# Patient Record
Sex: Female | Born: 1935 | Hispanic: Refuse to answer | Marital: Single
Health system: Midwestern US, Academic
[De-identification: ages and names within clinical notes are randomized; demographics above are authoritative.]

---

## 2017-01-27 ENCOUNTER — Encounter: Admit: 2017-01-27 | Discharge: 2017-01-27 | Payer: MEDICARE

## 2017-01-30 ENCOUNTER — Ambulatory Visit: Admit: 2017-01-30 | Discharge: 2017-01-31 | Payer: MEDICARE

## 2017-01-31 DIAGNOSIS — Z950 Presence of cardiac pacemaker: ICD-10-CM

## 2017-01-31 DIAGNOSIS — R55 Syncope and collapse: Principal | ICD-10-CM

## 2017-02-20 ENCOUNTER — Encounter: Admit: 2017-02-20 | Discharge: 2017-02-20 | Payer: MEDICARE

## 2017-02-20 DIAGNOSIS — I4891 Unspecified atrial fibrillation: Principal | ICD-10-CM

## 2017-03-21 ENCOUNTER — Encounter: Admit: 2017-03-21 | Discharge: 2017-03-21 | Payer: MEDICARE

## 2017-03-21 DIAGNOSIS — I4891 Unspecified atrial fibrillation: Principal | ICD-10-CM

## 2017-03-21 DIAGNOSIS — I482 Chronic atrial fibrillation, unspecified: Principal | ICD-10-CM

## 2017-03-21 LAB — CBC
Lab: 4.1 — ABNORMAL LOW (ref 4.20–5.40)
Lab: 4.1 — ABNORMAL LOW (ref 4.8–10.8)

## 2017-03-21 LAB — THYROID STIMULATING HORMONE-TSH: Lab: 4.5 — ABNORMAL LOW (ref 98–107)

## 2017-03-21 LAB — COMPREHENSIVE METABOLIC PANEL
Lab: 1.1 — ABNORMAL HIGH (ref 0.0–1.0)
Lab: 129 — ABNORMAL LOW (ref 136–145)
Lab: 14
Lab: 14
Lab: 25
Lab: 3.8
Lab: 72
Lab: 82
Lab: 83

## 2017-03-21 LAB — LIPID PROFILE
Lab: 158
Lab: 87 — ABNORMAL LOW (ref 33.0–37.0)
Lab: 9

## 2017-03-21 LAB — 25-OH VITAMIN D (D2 + D3): Lab: 37 — ABNORMAL HIGH (ref 35–60)

## 2017-03-21 NOTE — Telephone Encounter
Standing INR order faxed to [facility] at [phone].

## 2017-03-27 ENCOUNTER — Encounter: Admit: 2017-03-27 | Discharge: 2017-03-27 | Payer: MEDICARE

## 2017-03-27 NOTE — Telephone Encounter
Attempted to return call and got the AM. Left message indicating she was to have surgery on her hand and needed to hold warfarin. Attempted to call pt and no answer. Left message indicating that she needs to have Dr.Strong's office to let us know what the plan is and how long the warfarin needs to be held. Will need to review with Dr. Barry Dieneswens.

## 2017-03-28 NOTE — Telephone Encounter
Pt called and states that Dr. Larina BrasStone is planning on excising a "spot of skin cancer" on her hand.  Dr. Larina BrasStone requested that she hold coumadin for 5 days prior to to the procedure and instructed her that she would need to use Lovenox injections while off of the coumadin.  Pt states that she would like Dr. Barry Dieneswens' recommendations as she has never had to hold her coumadin or do the lovenox injections before.  Her procedure is scheduled for 8/20.  She states that a long time ago, she had an incident where they thought she might have had a stroke, but then told her that she did not have a stroke and so she was concerned about her stroke risk.  However, she does not understand the need to change anticoagulants for the procedure.  Discussed with patient that lovenox is often used while the coumadin is held, and that determination is based upon her stroke risk.  She verbalized understanding, but wanted to know from Dr. Barry Dieneswens' if it would be safe for her to hold the coumadin, and if she would actually need to use the Lovenox injections.      Will route to Dr. Barry Dieneswens for his recommendations and will follow up with patient.

## 2017-04-03 NOTE — Telephone Encounter
Patient is questioning the need to bridge with lovenox.  Discussed with SDO in clinic.  SDO advises to bridge with lovenox twice daily before excision of lesion and then okay to just resume warfarin post procedure without bridging with lovenox.  Attempted to call pt with recommendations, LM asking for return call to review.

## 2017-04-04 NOTE — Telephone Encounter
Called and left message with Dr. Barry Dienes' recommendations.  Left callback number for any questions or concerns.

## 2017-04-08 ENCOUNTER — Encounter: Admit: 2017-04-08 | Discharge: 2017-04-08 | Payer: MEDICARE

## 2017-04-08 NOTE — Telephone Encounter
Spoke with Dr. Allen Derry.  He reports that he did advise the patient not to take coumadin or lovenox because of the risk for bleeding post skin graft.  If there is bleeding or blood pooling the graft could fail which would cause another interruption in anticoagulation therapy.  Dr. Larina Bras reports he is aware of her risk for stroke.  Flag sent to [facility] as FYI. Left message for patient letting her know Dr. Hayden Rasmussen recommendations.

## 2017-04-10 ENCOUNTER — Encounter: Admit: 2017-04-10 | Discharge: 2017-04-10 | Payer: MEDICARE

## 2017-04-14 ENCOUNTER — Encounter: Admit: 2017-04-14 | Discharge: 2017-04-14 | Payer: MEDICARE

## 2017-04-14 DIAGNOSIS — I482 Chronic atrial fibrillation, unspecified: Principal | ICD-10-CM

## 2017-04-16 ENCOUNTER — Encounter: Admit: 2017-04-16 | Discharge: 2017-04-16 | Payer: MEDICARE

## 2017-04-16 DIAGNOSIS — I482 Chronic atrial fibrillation, unspecified: ICD-10-CM

## 2017-04-16 DIAGNOSIS — Z7901 Long term (current) use of anticoagulants: Principal | ICD-10-CM

## 2017-04-16 NOTE — Telephone Encounter
Received msg on triage line from pt stating her phone is not ringing for some reason today and it keeps going straight to voicemail. She just wanted to let us know we will need to leave msg with INR results when available. Notified Brett CanalesSteve in [place]. No further needs identified at this time.

## 2017-04-18 ENCOUNTER — Encounter: Admit: 2017-04-18 | Discharge: 2017-04-18 | Payer: MEDICARE

## 2017-04-18 DIAGNOSIS — I482 Chronic atrial fibrillation, unspecified: Principal | ICD-10-CM

## 2017-04-22 ENCOUNTER — Encounter: Admit: 2017-04-22 | Discharge: 2017-04-22 | Payer: MEDICARE

## 2017-04-22 DIAGNOSIS — I482 Chronic atrial fibrillation, unspecified: Principal | ICD-10-CM

## 2017-04-24 ENCOUNTER — Ambulatory Visit: Admit: 2017-04-24 | Discharge: 2017-04-25 | Payer: MEDICARE

## 2017-04-24 ENCOUNTER — Encounter: Admit: 2017-04-24 | Discharge: 2017-04-24 | Payer: MEDICARE

## 2017-04-24 DIAGNOSIS — I872 Venous insufficiency (chronic) (peripheral): ICD-10-CM

## 2017-04-24 DIAGNOSIS — R001 Bradycardia, unspecified: ICD-10-CM

## 2017-04-24 DIAGNOSIS — I10 Essential (primary) hypertension: ICD-10-CM

## 2017-04-24 DIAGNOSIS — I4891 Unspecified atrial fibrillation: Principal | ICD-10-CM

## 2017-04-24 DIAGNOSIS — E785 Hyperlipidemia, unspecified: ICD-10-CM

## 2017-04-24 DIAGNOSIS — Z950 Presence of cardiac pacemaker: ICD-10-CM

## 2017-04-24 DIAGNOSIS — I482 Chronic atrial fibrillation, unspecified: Principal | ICD-10-CM

## 2017-04-24 NOTE — Assessment & Plan Note
She couldn't use the prescription compression stockings, but she's using some elastic stockings that do OK.

## 2017-04-24 NOTE — Assessment & Plan Note
She did OK holding warfarin for resection of a left hand skin lesion.  She bridged with Lovenox pre-procedure, but simply re-started warfarin post-operatively.  She didn't have any embolic or bleeding problems.

## 2017-04-24 NOTE — Assessment & Plan Note
She had a remote transmission in late June showing normal pacing system function.

## 2017-04-24 NOTE — Progress Notes
Date of Service: 04/24/2017    Desiree Meadows is a 83 y.o. female.       HPI     Sister Desiree Meadows was in the [place] office today for follow-up regarding her permanent atrial fibrillation and cardiac pacemaker.  She is done well over the past 6 months and has had no cardiac symptoms.  When I saw her in April she was concerned that she was falling asleep easily but this problem seems to have taken care of itself.    She denies any trouble with chest pain or breathlessness.  She has chronic lower extremity edema related to venous insufficiency.  She cannot really use the prescription stockings because they are too hard to get on but she is using some elastic stockings work adequately.         Vitals:    04/24/17 0918 04/24/17 0928   BP: 146/90 140/84   Pulse: 64    Weight: 72.2 kg (159 lb 3.2 oz)    Height: 1.676 m (5' 6)      Body mass index is 25.7 kg/m???.     Past Medical History  Patient Active Problem List    Diagnosis Date Noted   ??? Drowsiness 11/28/2016   ??? Venous insufficiency of both lower extremities 01/10/2015   ??? Bradycardia 03/08/2014   ??? Cardiac pacemaker 01/04/2014     01/04/14 St. Jude VVIR pacemaker implanted by [facility]    04/07/14 - Re-programmed from VVI to VVIR mode.  Patient had complained of subtle symptoms with exertion while in VVI mode     ??? Falls 07/09/2012   ??? Hyperlipidemia 01/31/2011   ??? Atrial fibrillation (HCC) 01/12/2009      rate is controlled and the patient is orally      anticoagulated.     ??? HTN (hypertension) 01/12/2009     Poorly controlled     ??? Hypothyroidism 01/12/2009     on thyroid replacement therapy.           Review of Systems   Constitution: Negative.   HENT: Negative.    Eyes: Negative.    Cardiovascular: Positive for claudication and leg swelling.   Respiratory: Negative.    Endocrine: Negative.    Hematologic/Lymphatic: Negative.    Skin: Negative.    Musculoskeletal: Positive for muscle weakness.   Gastrointestinal: Negative.    Genitourinary: Negative. Neurological: Negative.    Psychiatric/Behavioral: Negative.    Allergic/Immunologic: Negative.        Physical Exam    Physical Exam   General Appearance: no distress   Skin: warm, no ulcers or xanthomas   Digits and Nails: no cyanosis or clubbing   Eyes: conjunctivae and lids normal, pupils are equal and round   Teeth/Gums/Palate: dentition unremarkable, no lesions   Lips & Oral Mucosa: no pallor or cyanosis   Neck Veins: normal JVP , neck veins are not distended   Thyroid: no nodules, masses, tenderness or enlargement   Chest Inspection: chest is normal in appearance   Respiratory Effort: breathing comfortably, no respiratory distress   Auscultation/Percussion: lungs clear to auscultation, no rales or rhonchi, no wheezing   PMI: PMI not enlarged or displaced   Cardiac Rhythm: regular rhythm and normal rate   Cardiac Auscultation: S1, S2 normal, no rub, no gallop   Murmurs: no murmur   Peripheral Circulation: normal peripheral circulation   Carotid Arteries: normal carotid upstroke bilaterally, no bruits   Radial Arteries: normal symmetric radial pulses   Abdominal Aorta: no  abdominal aortic bruit   Pedal Pulses: normal symmetric pedal pulses   Lower Extremity Edema: 1+ bilateral lower extremity edema   Abdominal Exam: soft, non-tender, no masses, bowel sounds normal   Liver & Spleen: no organomegaly   Gait & Station: walks without assistance   Muscle Strength: normal muscle tone   Orientation: oriented to time, place and person   Affect & Mood: appropriate and sustained affect   Language and Memory: patient responsive and seems to comprehend information   Neurologic Exam: neurological assessment grossly intact   Other: moves all extremities        Problems Addressed Today  Encounter Diagnoses   Name Primary?   ??? Chronic atrial fibrillation (HCC)    ??? Venous insufficiency of both lower extremities    ??? Cardiac pacemaker        Assessment and Plan       Atrial fibrillation (HCC) She did OK holding warfarin for resection of a left hand skin lesion.  She bridged with Lovenox pre-procedure, but simply re-started warfarin post-operatively.  She didn't have any embolic or bleeding problems.    Venous insufficiency of both lower extremities  She couldn't use the prescription compression stockings, but she's using some elastic stockings that do OK.    Cardiac pacemaker  She had a remote transmission in late June showing normal pacing system function.      Current Medications (including today's revisions)  ??? CALCIUM CARBONATE/VITAMIN D3 (CALCIUM + D PO) Take 1 Tab by mouth Daily.   ??? cyanocobalamin (VITAMIN B-12) 1,000 mcg/mL injection Inject 1,000 mcg to area(s) as directed Every 30 Days.   ??? diphenhydrAMINE (BENADRYL) 50 mg tablet Take one tablet the morning of your procedure (Patient taking differently: as Needed.)   ??? levothyroxine (SYNTHROID) 175 mcg tablet Take 1 tablet by mouth daily.   ??? losartan (COZAAR) 50 mg tablet Take 1 Tab by mouth twice daily. (Patient taking differently: Take 25 mg by mouth twice daily.)   ??? MULTIVITAMIN PO Take 1 tablet by mouth daily.   ??? pregabalin (LYRICA) 25 mg capsule Take 75 mg by mouth at bedtime daily.   ??? warfarin (COUMADIN) 3 mg tablet TAKE 1 TABLET BY MOUTH MON-TUE-THUR-FRI-SAT AND 1.5 TABLETS (4.5MG ) WED AND SUN

## 2017-04-30 ENCOUNTER — Encounter: Admit: 2017-04-30 | Discharge: 2017-04-30 | Payer: MEDICARE

## 2017-05-01 ENCOUNTER — Ambulatory Visit: Admit: 2017-05-01 | Discharge: 2017-05-02 | Payer: MEDICARE

## 2017-05-01 DIAGNOSIS — R55 Syncope and collapse: Principal | ICD-10-CM

## 2017-05-02 ENCOUNTER — Encounter: Admit: 2017-05-02 | Discharge: 2017-05-02 | Payer: MEDICARE

## 2017-05-02 DIAGNOSIS — I482 Chronic atrial fibrillation, unspecified: Principal | ICD-10-CM

## 2017-05-02 DIAGNOSIS — Z950 Presence of cardiac pacemaker: Secondary | ICD-10-CM

## 2017-05-19 ENCOUNTER — Encounter: Admit: 2017-05-19 | Discharge: 2017-05-19 | Payer: MEDICARE

## 2017-05-19 DIAGNOSIS — Z950 Presence of cardiac pacemaker: Principal | ICD-10-CM

## 2017-05-30 ENCOUNTER — Encounter: Admit: 2017-05-30 | Discharge: 2017-05-30 | Payer: MEDICARE

## 2017-05-30 DIAGNOSIS — I482 Chronic atrial fibrillation, unspecified: Principal | ICD-10-CM

## 2017-06-06 ENCOUNTER — Encounter: Admit: 2017-06-06 | Discharge: 2017-06-06 | Payer: MEDICARE

## 2017-06-06 DIAGNOSIS — I482 Chronic atrial fibrillation, unspecified: Principal | ICD-10-CM

## 2017-06-10 ENCOUNTER — Encounter: Admit: 2017-06-10 | Discharge: 2017-06-10 | Payer: MEDICARE

## 2017-06-10 ENCOUNTER — Ambulatory Visit: Admit: 2017-06-10 | Discharge: 2017-06-11 | Payer: MEDICARE

## 2017-06-10 DIAGNOSIS — Z950 Presence of cardiac pacemaker: Principal | ICD-10-CM

## 2017-07-01 ENCOUNTER — Encounter: Admit: 2017-07-01 | Discharge: 2017-07-01 | Payer: MEDICARE

## 2017-07-01 DIAGNOSIS — I482 Chronic atrial fibrillation, unspecified: Principal | ICD-10-CM

## 2017-07-01 LAB — CBC
Lab: 101 — ABNORMAL HIGH (ref 80.0–99.0)
Lab: 12
Lab: 13
Lab: 206
Lab: 32 — ABNORMAL HIGH (ref 27.0–31.0)
Lab: 32 — ABNORMAL LOW (ref 33.0–37.0)
Lab: 4.1 — ABNORMAL LOW (ref 4.20–5.40)
Lab: 4.9
Lab: 41

## 2017-07-01 LAB — COMPREHENSIVE METABOLIC PANEL
Lab: 13
Lab: 132 — ABNORMAL LOW (ref 136–145)
Lab: 15
Lab: 25
Lab: 77

## 2017-07-01 NOTE — Progress Notes
07/01/17: INR 2.2. Left voicemail for patient with INR results, Warfarin instructions, recheck date, and call back number for any questions. Jonathon BellowsAMiller, Charity fundraiserN.

## 2017-07-09 ENCOUNTER — Encounter: Admit: 2017-07-09 | Discharge: 2017-07-09 | Payer: MEDICARE

## 2017-07-28 ENCOUNTER — Encounter: Admit: 2017-07-28 | Discharge: 2017-07-28 | Payer: MEDICARE

## 2017-07-28 DIAGNOSIS — I482 Chronic atrial fibrillation, unspecified: Principal | ICD-10-CM

## 2017-07-28 NOTE — Progress Notes
07/28/2017 3:30 PM     Sister Margaretha GlassingLoretta called and said that the have been without greens this week which has effected her INR. She said that she is expecting to get greens this week which should correct the problem. Stay on current dose as regular green intake should be sufficient. Recheck in one week.

## 2017-07-31 ENCOUNTER — Ambulatory Visit: Admit: 2017-07-31 | Discharge: 2017-08-01 | Payer: MEDICARE

## 2017-08-01 DIAGNOSIS — Z950 Presence of cardiac pacemaker: ICD-10-CM

## 2017-08-01 DIAGNOSIS — R55 Syncope and collapse: Principal | ICD-10-CM

## 2017-08-05 ENCOUNTER — Encounter: Admit: 2017-08-05 | Discharge: 2017-08-05 | Payer: MEDICARE

## 2017-08-05 DIAGNOSIS — I482 Chronic atrial fibrillation, unspecified: Principal | ICD-10-CM

## 2017-08-28 ENCOUNTER — Encounter: Admit: 2017-08-28 | Discharge: 2017-08-28 | Payer: MEDICARE

## 2017-08-28 DIAGNOSIS — I482 Chronic atrial fibrillation, unspecified: Principal | ICD-10-CM

## 2017-09-30 LAB — COMPREHENSIVE METABOLIC PANEL
Lab: 0.6
Lab: 114
Lab: 114 — ABNORMAL HIGH (ref 83–110)
Lab: 16 — ABNORMAL HIGH (ref 0–14)
Lab: 20
Lab: 3.9
Lab: 33
Lab: 73
Lab: 8.4 — ABNORMAL HIGH (ref 6.2–8.1)
Lab: 9.6

## 2017-10-01 ENCOUNTER — Encounter: Admit: 2017-10-01 | Discharge: 2017-10-01 | Payer: MEDICARE

## 2017-10-01 DIAGNOSIS — I482 Chronic atrial fibrillation, unspecified: Principal | ICD-10-CM

## 2017-10-07 ENCOUNTER — Encounter: Admit: 2017-10-07 | Discharge: 2017-10-07 | Payer: MEDICARE

## 2017-10-07 DIAGNOSIS — I482 Chronic atrial fibrillation, unspecified: Principal | ICD-10-CM

## 2017-10-09 ENCOUNTER — Encounter: Admit: 2017-10-09 | Discharge: 2017-10-09 | Payer: MEDICARE

## 2017-10-14 ENCOUNTER — Encounter: Admit: 2017-10-14 | Discharge: 2017-10-14 | Payer: MEDICARE

## 2017-10-14 DIAGNOSIS — I482 Chronic atrial fibrillation, unspecified: Principal | ICD-10-CM

## 2017-10-14 LAB — PROTIME INR (PT): Lab: 2.7

## 2017-10-28 ENCOUNTER — Encounter: Admit: 2017-10-28 | Discharge: 2017-10-28 | Payer: MEDICARE

## 2017-10-30 ENCOUNTER — Ambulatory Visit: Admit: 2017-10-30 | Discharge: 2017-10-31 | Payer: MEDICARE

## 2017-10-31 DIAGNOSIS — R55 Syncope and collapse: Principal | ICD-10-CM

## 2017-10-31 DIAGNOSIS — Z950 Presence of cardiac pacemaker: ICD-10-CM

## 2017-12-03 ENCOUNTER — Encounter: Admit: 2017-12-03 | Discharge: 2017-12-03 | Payer: MEDICARE

## 2017-12-03 DIAGNOSIS — I482 Chronic atrial fibrillation, unspecified: Principal | ICD-10-CM

## 2017-12-03 LAB — PROTIME INR (PT): Lab: 2.5 % — ABNORMAL LOW (ref 40–50)

## 2018-01-05 ENCOUNTER — Encounter: Admit: 2018-01-05 | Discharge: 2018-01-05 | Payer: MEDICARE

## 2018-01-06 ENCOUNTER — Encounter: Admit: 2018-01-06 | Discharge: 2018-01-06 | Payer: MEDICARE

## 2018-01-06 DIAGNOSIS — I482 Chronic atrial fibrillation, unspecified: Principal | ICD-10-CM

## 2018-01-06 LAB — PROTIME INR (PT): Lab: 3.5

## 2018-01-07 ENCOUNTER — Encounter: Admit: 2018-01-07 | Discharge: 2018-01-07 | Payer: MEDICARE

## 2018-01-07 DIAGNOSIS — I482 Chronic atrial fibrillation, unspecified: Principal | ICD-10-CM

## 2018-01-13 ENCOUNTER — Encounter: Admit: 2018-01-13 | Discharge: 2018-01-13 | Payer: MEDICARE

## 2018-01-13 DIAGNOSIS — I482 Chronic atrial fibrillation, unspecified: Principal | ICD-10-CM

## 2018-01-19 ENCOUNTER — Encounter: Admit: 2018-01-19 | Discharge: 2018-01-19 | Payer: MEDICARE

## 2018-01-19 DIAGNOSIS — I482 Chronic atrial fibrillation, unspecified: Principal | ICD-10-CM

## 2018-01-19 LAB — PROTIME INR (PT): Lab: 2.6

## 2018-01-29 ENCOUNTER — Ambulatory Visit: Admit: 2018-01-29 | Discharge: 2018-01-30 | Payer: MEDICARE

## 2018-01-30 DIAGNOSIS — Z950 Presence of cardiac pacemaker: Principal | ICD-10-CM

## 2018-02-10 ENCOUNTER — Ambulatory Visit: Admit: 2018-02-10 | Discharge: 2018-02-11 | Payer: MEDICARE

## 2018-02-10 ENCOUNTER — Encounter: Admit: 2018-02-10 | Discharge: 2018-02-10 | Payer: MEDICARE

## 2018-02-10 DIAGNOSIS — Z959 Presence of cardiac and vascular implant and graft, unspecified: Principal | ICD-10-CM

## 2018-02-10 DIAGNOSIS — Z950 Presence of cardiac pacemaker: Principal | ICD-10-CM

## 2018-02-13 ENCOUNTER — Encounter: Admit: 2018-02-13 | Discharge: 2018-02-13 | Payer: MEDICARE

## 2018-02-13 DIAGNOSIS — I482 Chronic atrial fibrillation, unspecified: Principal | ICD-10-CM

## 2018-02-13 LAB — COMPREHENSIVE METABOLIC PANEL
Lab: 0.9
Lab: 132 — ABNORMAL LOW (ref 136–145)
Lab: 15
Lab: 15 — ABNORMAL HIGH (ref 0–14)
Lab: 27
Lab: 3.8
Lab: 7.5
Lab: 73
Lab: 97

## 2018-02-13 LAB — CBC: Lab: 4.7 — ABNORMAL LOW (ref 4.8–10.8)

## 2018-02-13 LAB — PROTIME INR (PT): Lab: 1.8

## 2018-02-13 LAB — LIPID PROFILE
Lab: 161
Lab: 3
Lab: 58
Lab: 88 — ABNORMAL HIGH (ref 27.0–31.0)
Lab: 9 — ABNORMAL LOW (ref 33.0–37.0)

## 2018-02-13 LAB — THYROID STIMULATING HORMONE-TSH: Lab: 1.6 — ABNORMAL LOW (ref 98–107)

## 2018-02-17 ENCOUNTER — Encounter: Admit: 2018-02-17 | Discharge: 2018-02-17 | Payer: MEDICARE

## 2018-02-20 ENCOUNTER — Encounter: Admit: 2018-02-20 | Discharge: 2018-02-20 | Payer: MEDICARE

## 2018-02-20 DIAGNOSIS — I482 Chronic atrial fibrillation, unspecified: Principal | ICD-10-CM

## 2018-02-20 LAB — PROTIME INR (PT): Lab: 2.6

## 2018-03-05 ENCOUNTER — Encounter: Admit: 2018-03-05 | Discharge: 2018-03-05 | Payer: MEDICARE

## 2018-03-05 ENCOUNTER — Ambulatory Visit: Admit: 2018-03-05 | Discharge: 2018-03-06 | Payer: MEDICARE

## 2018-03-05 DIAGNOSIS — I482 Chronic atrial fibrillation, unspecified: ICD-10-CM

## 2018-03-05 DIAGNOSIS — E785 Hyperlipidemia, unspecified: ICD-10-CM

## 2018-03-05 DIAGNOSIS — I872 Venous insufficiency (chronic) (peripheral): Principal | ICD-10-CM

## 2018-03-05 DIAGNOSIS — Z950 Presence of cardiac pacemaker: ICD-10-CM

## 2018-03-05 DIAGNOSIS — I10 Essential (primary) hypertension: ICD-10-CM

## 2018-03-05 DIAGNOSIS — R001 Bradycardia, unspecified: ICD-10-CM

## 2018-03-05 DIAGNOSIS — I4891 Unspecified atrial fibrillation: Principal | ICD-10-CM

## 2018-03-06 ENCOUNTER — Encounter: Admit: 2018-03-06 | Discharge: 2018-03-06 | Payer: MEDICARE

## 2018-03-06 DIAGNOSIS — I482 Chronic atrial fibrillation, unspecified: Principal | ICD-10-CM

## 2018-03-06 LAB — PROTIME INR (PT): Lab: 2.2

## 2018-03-23 ENCOUNTER — Encounter: Admit: 2018-03-23 | Discharge: 2018-03-23 | Payer: MEDICARE

## 2018-03-30 ENCOUNTER — Encounter: Admit: 2018-03-30 | Discharge: 2018-03-30 | Payer: MEDICARE

## 2018-03-30 LAB — COMPREHENSIVE METABOLIC PANEL
Lab: 0.7
Lab: 1
Lab: 12
Lab: 129 — ABNORMAL LOW (ref 136–145)
Lab: 18
Lab: 24
Lab: 26
Lab: 3.6
Lab: 4.2
Lab: 7.1
Lab: 77 — ABNORMAL LOW (ref 83–110)
Lab: 84
Lab: 9
Lab: 95 — ABNORMAL LOW (ref 98–107)

## 2018-03-30 LAB — PROTIME INR (PT): Lab: 2

## 2018-03-30 LAB — THYROID STIMULATING HORMONE-TSH: Lab: 3.3

## 2018-03-31 ENCOUNTER — Encounter: Admit: 2018-03-31 | Discharge: 2018-03-31 | Payer: MEDICARE

## 2018-04-17 ENCOUNTER — Encounter: Admit: 2018-04-17 | Discharge: 2018-04-17 | Payer: MEDICARE

## 2018-04-17 DIAGNOSIS — I482 Chronic atrial fibrillation, unspecified: Principal | ICD-10-CM

## 2018-04-17 LAB — PROTIME INR (PT): Lab: 2.2

## 2018-04-30 ENCOUNTER — Ambulatory Visit: Admit: 2018-04-30 | Discharge: 2018-05-01 | Payer: MEDICARE

## 2018-05-01 ENCOUNTER — Encounter: Admit: 2018-05-01 | Discharge: 2018-05-01 | Payer: MEDICARE

## 2018-05-01 DIAGNOSIS — Z950 Presence of cardiac pacemaker: Principal | ICD-10-CM

## 2018-05-04 ENCOUNTER — Encounter: Admit: 2018-05-04 | Discharge: 2018-05-04 | Payer: MEDICARE

## 2018-05-11 ENCOUNTER — Encounter: Admit: 2018-05-11 | Discharge: 2018-05-11 | Payer: MEDICARE

## 2018-05-15 ENCOUNTER — Encounter: Admit: 2018-05-15 | Discharge: 2018-05-15 | Payer: MEDICARE

## 2018-05-15 DIAGNOSIS — I482 Chronic atrial fibrillation, unspecified: ICD-10-CM

## 2018-05-15 LAB — PROTIME INR (PT): Lab: 2.8

## 2018-05-23 IMAGING — US [PERSON_NAME]
1 series · 14 of 16 positions shown · non-contrast
Comparison: none

[Series 1: us low ext left · 14 of 26 slices shown]
[im 1/26]
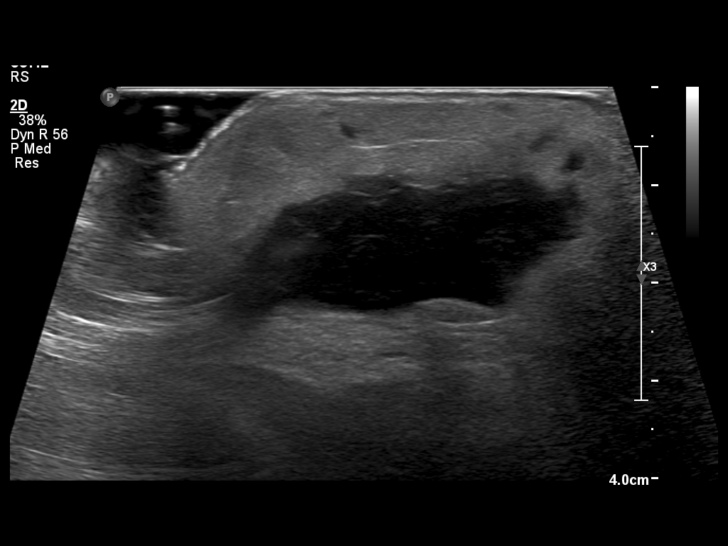
[im 2/26]
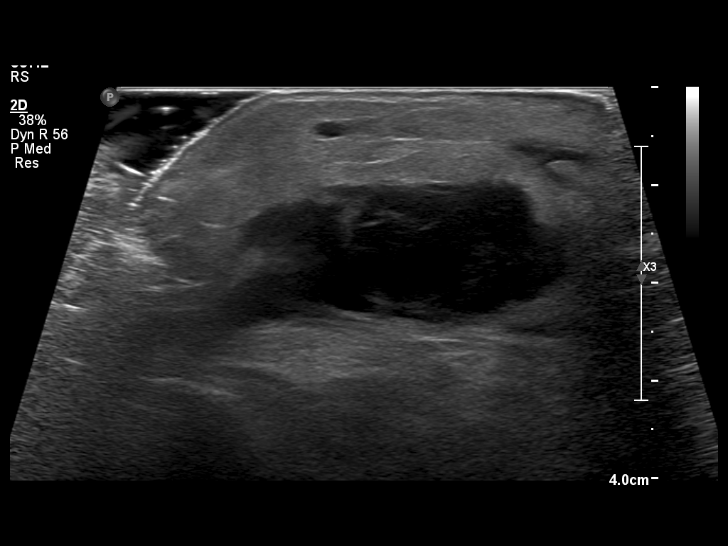
[im 4/26]
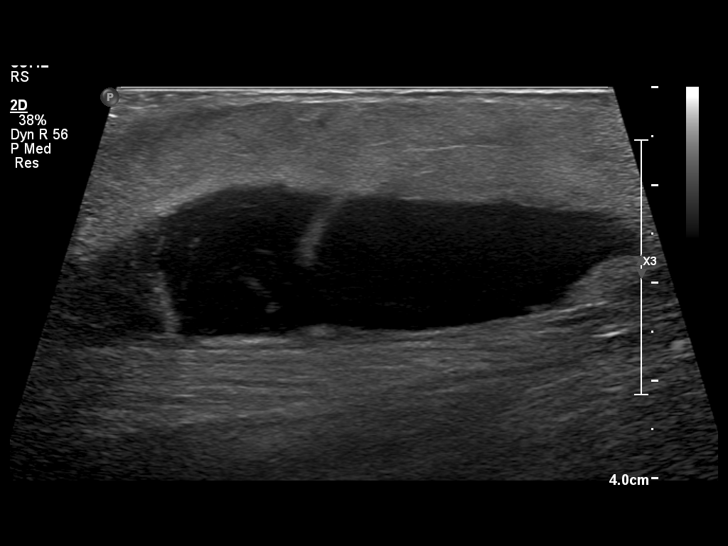
[im 7/26]
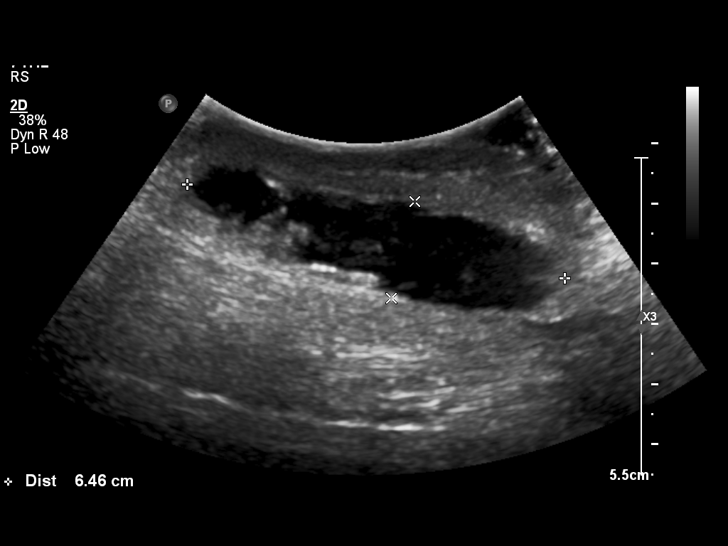
[im 9/26]
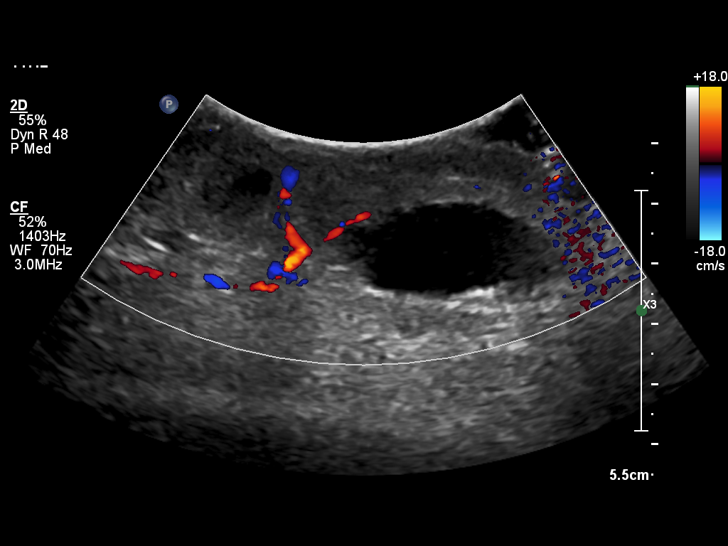
[im 11/26]
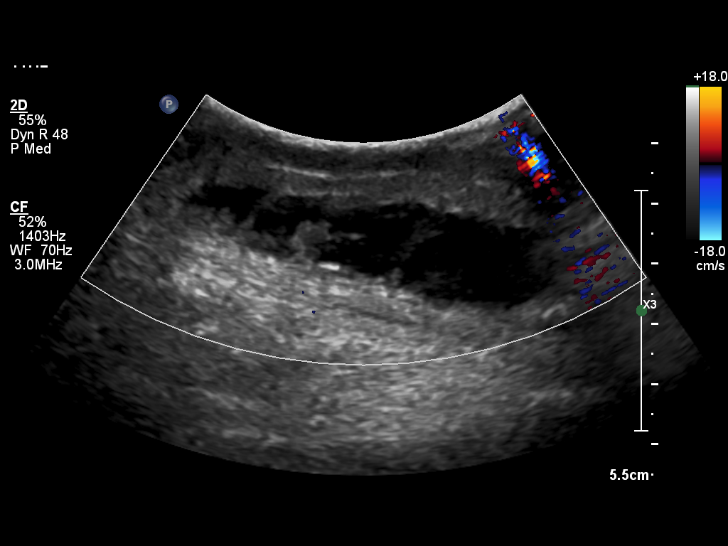
[im 12/26]
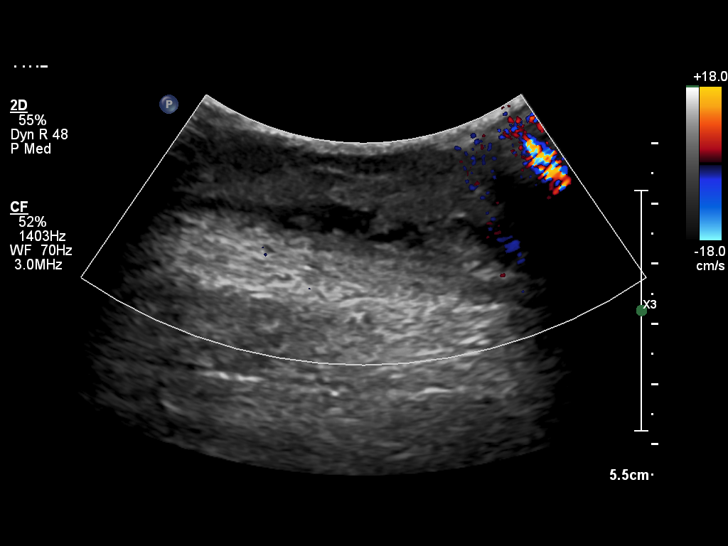
[im 14/26]
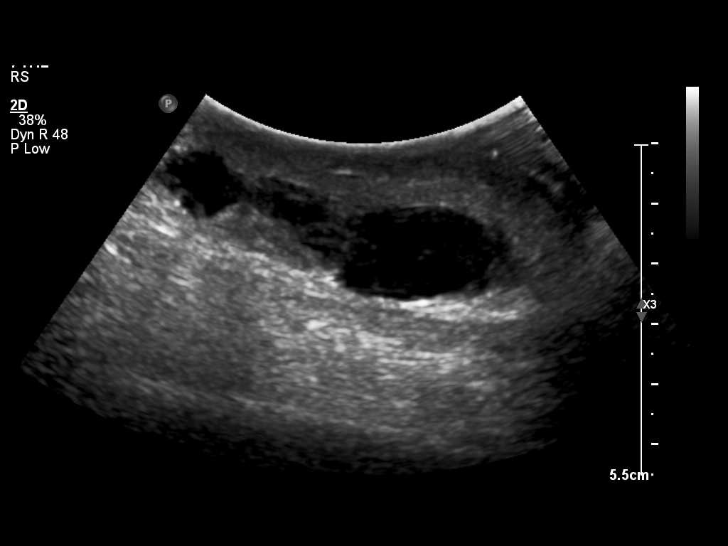
[im 16/26]
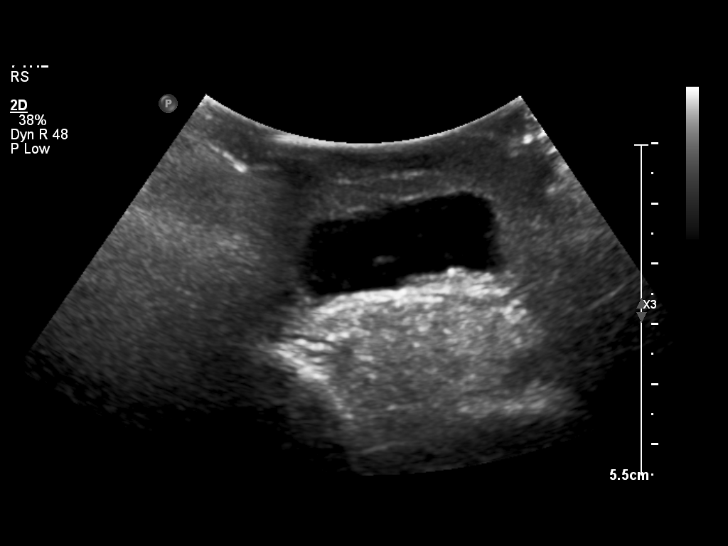
[im 17/26]
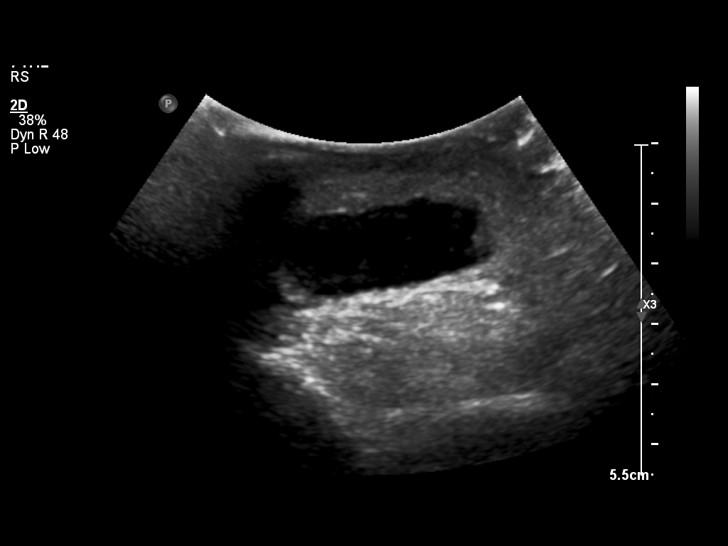
[im 21/26]
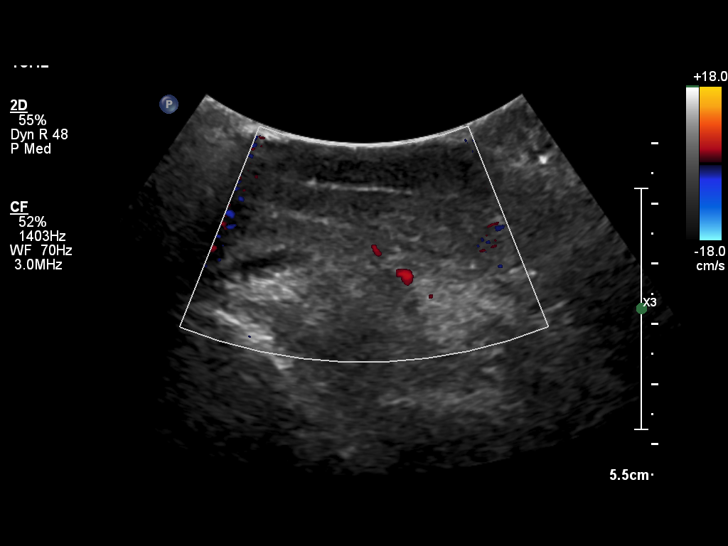
[im 22/26]
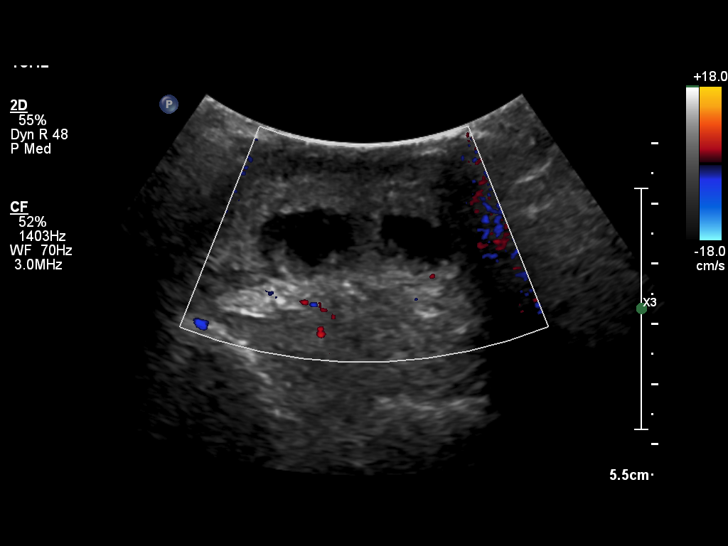
[im 24/26]
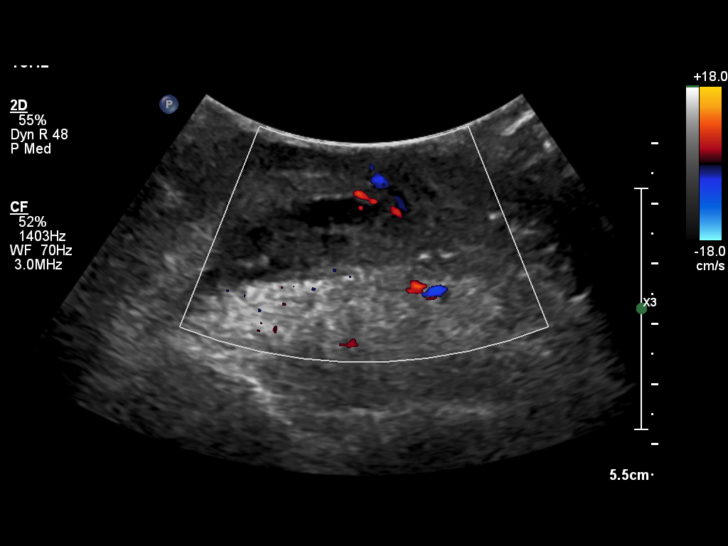
[im 26/26]
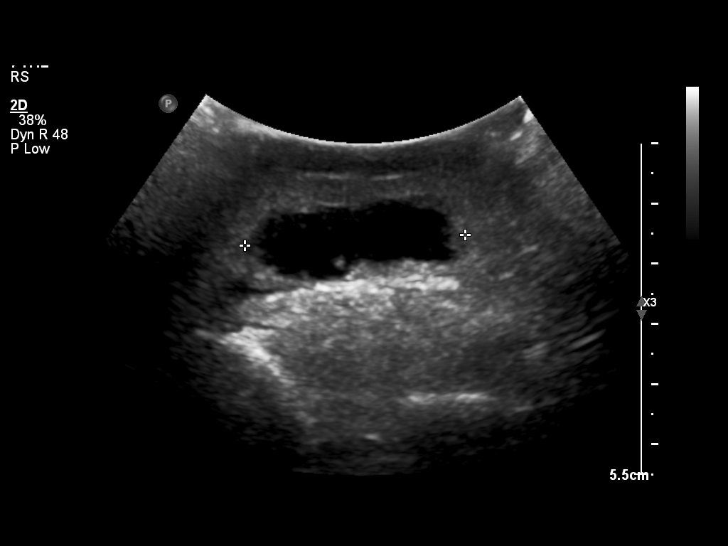

[14 of 16 positions shown; findings below may reference images not displayed]

EXAM

Left lower extremity ultrasound.

INDICATION

left lower leg mass
jl

TECHNIQUE

High-resolution grayscale and color Doppler interrogation of the left lower extremity palpable area
of concern.

COMPARISONS

No prior studies are available for comparison.

FINDINGS

There is a complex fluid collection at the mid left calf region which measures 6.5 x 1.7 x
centimeters in dimension. No intrinsic vascularity, though some adjacent hyperemia is present. F
indings are most likely reflective of a hematoma given provided clinical history of injury.

IMPRESSION

Complex fluid collection in the palpable area of concern. This is most likely reflective of a
hematoma.

Tech Notes:

jl

## 2018-05-23 IMAGING — CR LOW_EXM
3 series · 3 of 3 positions shown · non-contrast
Comparison: none

[foot]
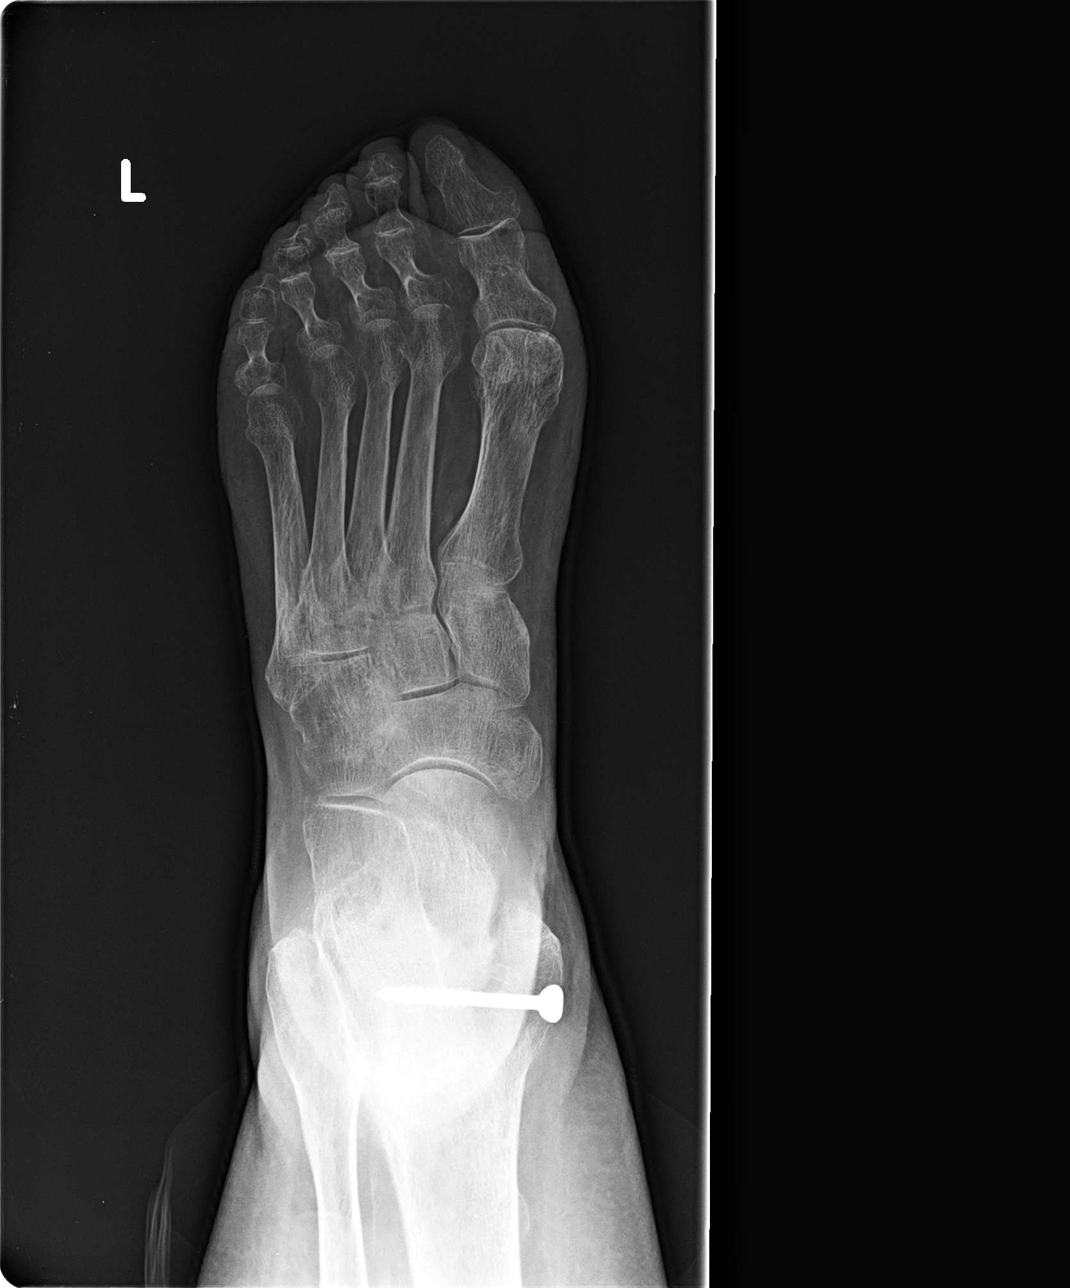

[foot obl]
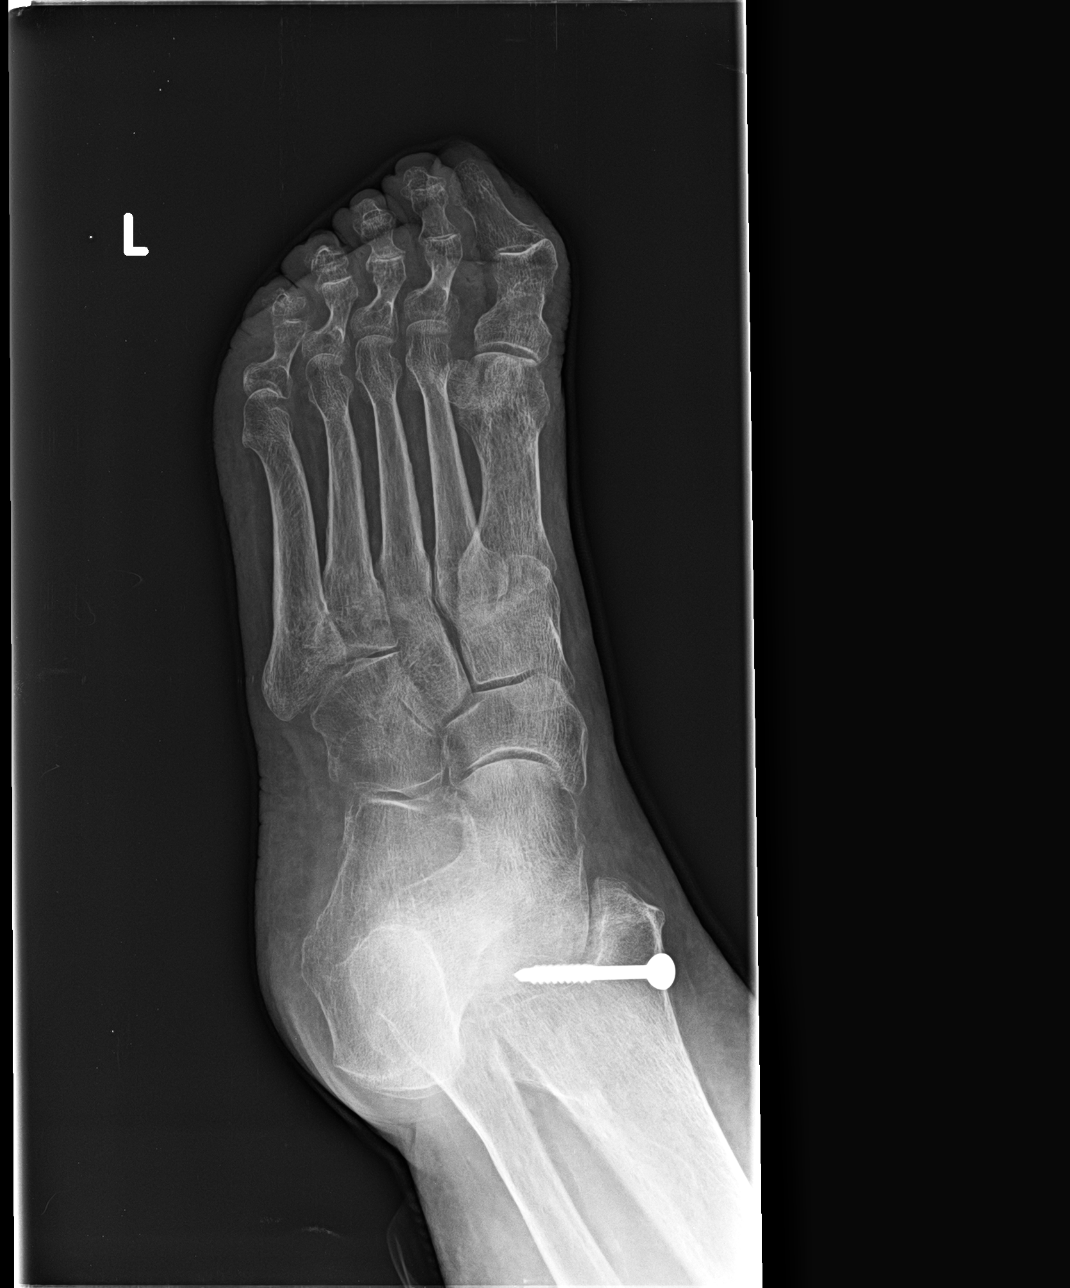

[foot lat]
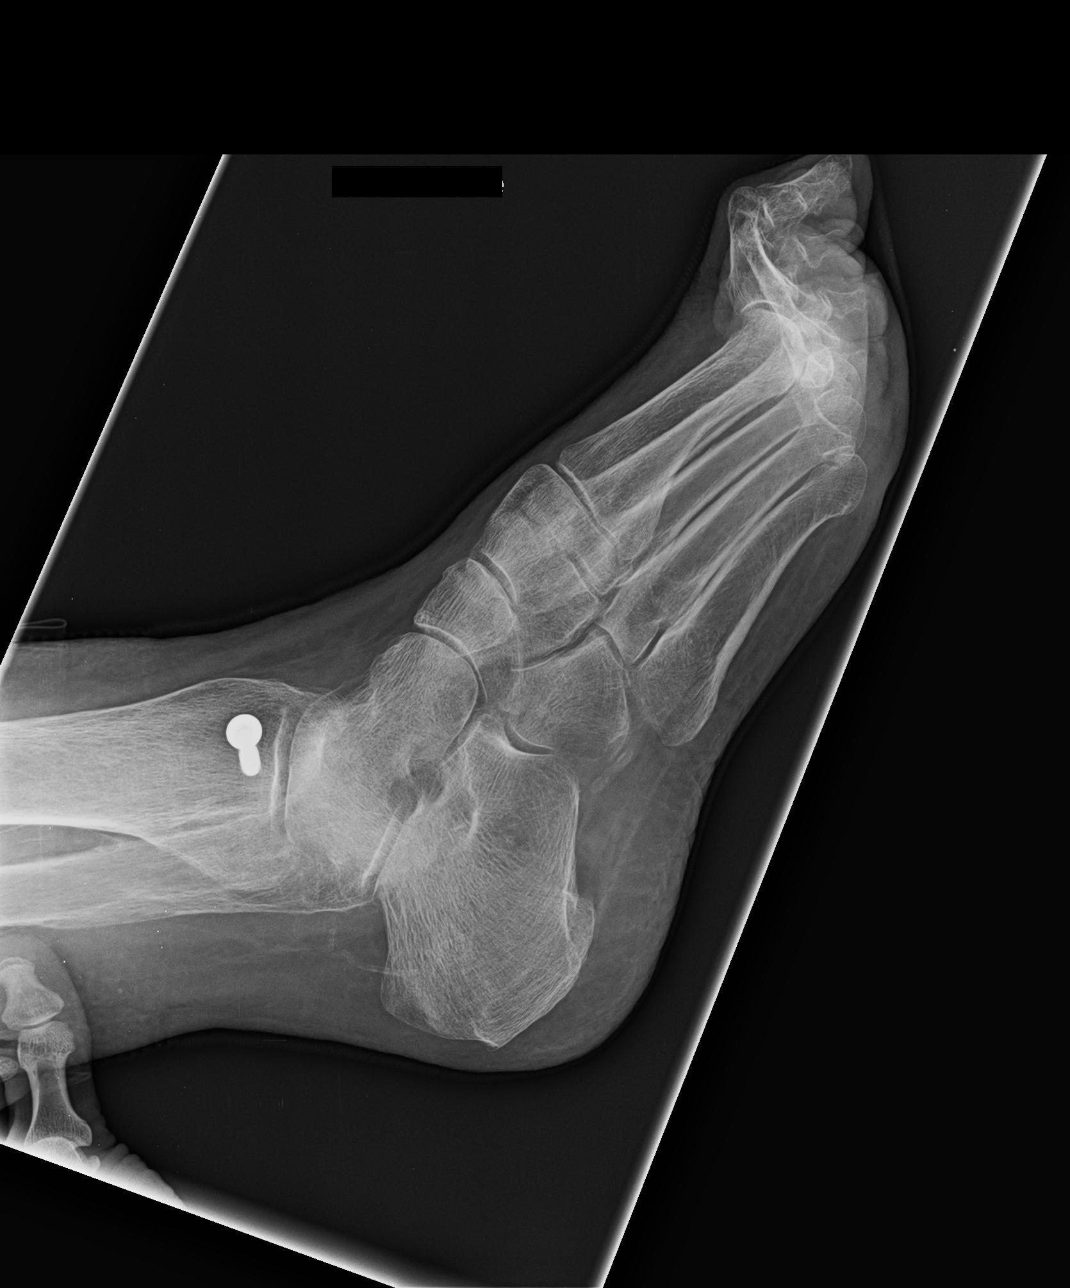

[3 of 3 positions shown; findings below may reference images not displayed]

DIAGNOSTIC STUDIES

EXAM

Left foot radiographs.

INDICATION

foot pain
pt. c/o pain. fall x2 weeks ago.

TECHNIQUE

AP, oblique, and lateral left foot views.

COMPARISONS

None.

FINDINGS

Bone demineralization. There is a cannulated fixation screw at the medial malleolus. Hammertoes
incidentally noted. No displaced fracture. No aggressive osseous lesion. Soft tissue thickening over
the anterior lower calf. Vascular calcifications are present

IMPRESSION

No acute osseous abnormality.

Tech Notes:

pt. c/o pain. fall x2 weeks ago.

## 2018-05-26 ENCOUNTER — Encounter: Admit: 2018-05-26 | Discharge: 2018-05-26 | Payer: MEDICARE

## 2018-06-01 LAB — COMPREHENSIVE METABOLIC PANEL
Lab: 0.7
Lab: 0.8
Lab: 10
Lab: 105
Lab: 131 — ABNORMAL LOW (ref 136–145)
Lab: 15 — ABNORMAL HIGH (ref 0–14)
Lab: 24
Lab: 24
Lab: 25 — ABNORMAL HIGH (ref 9.8–20.1)
Lab: 3.7
Lab: 4.2
Lab: 67
Lab: 7.5
Lab: 9.2
Lab: 96 — ABNORMAL LOW (ref 98–107)
Lab: 99

## 2018-06-05 ENCOUNTER — Encounter: Admit: 2018-06-05 | Discharge: 2018-06-05 | Payer: MEDICARE

## 2018-06-05 DIAGNOSIS — I482 Chronic atrial fibrillation, unspecified: Principal | ICD-10-CM

## 2018-06-05 LAB — PROTIME INR (PT): Lab: 1.7

## 2018-06-09 ENCOUNTER — Ambulatory Visit: Admit: 2018-06-09 | Discharge: 2018-06-10 | Payer: MEDICARE

## 2018-06-09 ENCOUNTER — Encounter: Admit: 2018-06-09 | Discharge: 2018-06-09 | Payer: MEDICARE

## 2018-06-09 DIAGNOSIS — Z950 Presence of cardiac pacemaker: ICD-10-CM

## 2018-06-09 DIAGNOSIS — I4891 Unspecified atrial fibrillation: Principal | ICD-10-CM

## 2018-06-09 DIAGNOSIS — I10 Essential (primary) hypertension: ICD-10-CM

## 2018-06-09 DIAGNOSIS — R001 Bradycardia, unspecified: ICD-10-CM

## 2018-06-09 DIAGNOSIS — I5032 Chronic diastolic (congestive) heart failure: ICD-10-CM

## 2018-06-09 DIAGNOSIS — E785 Hyperlipidemia, unspecified: ICD-10-CM

## 2018-06-12 ENCOUNTER — Encounter: Admit: 2018-06-12 | Discharge: 2018-06-12 | Payer: MEDICARE

## 2018-06-12 DIAGNOSIS — I482 Chronic atrial fibrillation, unspecified: Principal | ICD-10-CM

## 2018-06-12 LAB — PROTIME INR (PT): Lab: 2.7

## 2018-06-22 ENCOUNTER — Encounter: Admit: 2018-06-22 | Discharge: 2018-06-22 | Payer: MEDICARE

## 2018-06-22 DIAGNOSIS — I482 Chronic atrial fibrillation, unspecified: ICD-10-CM

## 2018-06-22 DIAGNOSIS — I4891 Unspecified atrial fibrillation: Principal | ICD-10-CM

## 2018-06-22 LAB — PROTIME INR (PT): Lab: 5.5 — ABNORMAL HIGH

## 2018-06-22 LAB — BASIC METABOLIC PANEL
Lab: 140
Lab: 4.4

## 2018-06-24 ENCOUNTER — Encounter: Admit: 2018-06-24 | Discharge: 2018-06-24 | Payer: MEDICARE

## 2018-06-24 DIAGNOSIS — I482 Chronic atrial fibrillation, unspecified: Principal | ICD-10-CM

## 2018-06-24 LAB — PROTIME INR (PT): Lab: 3.5

## 2018-06-29 ENCOUNTER — Encounter: Admit: 2018-06-29 | Discharge: 2018-06-29 | Payer: MEDICARE

## 2018-06-29 DIAGNOSIS — I482 Chronic atrial fibrillation, unspecified: Principal | ICD-10-CM

## 2018-06-29 LAB — PROTIME INR (PT): Lab: 2.8

## 2018-06-30 ENCOUNTER — Encounter: Admit: 2018-06-30 | Discharge: 2018-06-30 | Payer: MEDICARE

## 2018-07-06 LAB — PROTIME INR (PT): Lab: 3 MMOL/L (ref >60)

## 2018-07-07 ENCOUNTER — Encounter: Admit: 2018-07-07 | Discharge: 2018-07-07 | Payer: MEDICARE

## 2018-07-07 DIAGNOSIS — I482 Chronic atrial fibrillation, unspecified: Principal | ICD-10-CM

## 2018-07-07 DIAGNOSIS — I4891 Unspecified atrial fibrillation: Principal | ICD-10-CM

## 2018-07-13 ENCOUNTER — Encounter: Admit: 2018-07-13 | Discharge: 2018-07-13 | Payer: MEDICARE

## 2018-07-13 DIAGNOSIS — I4891 Unspecified atrial fibrillation: Principal | ICD-10-CM

## 2018-07-13 DIAGNOSIS — I482 Chronic atrial fibrillation, unspecified: Principal | ICD-10-CM

## 2018-07-13 LAB — PROTIME INR (PT): Lab: 3

## 2018-07-14 ENCOUNTER — Encounter: Admit: 2018-07-14 | Discharge: 2018-07-14 | Payer: MEDICARE

## 2018-07-14 ENCOUNTER — Ambulatory Visit: Admit: 2018-07-14 | Discharge: 2018-07-15 | Payer: MEDICARE

## 2018-07-14 DIAGNOSIS — R001 Bradycardia, unspecified: ICD-10-CM

## 2018-07-14 DIAGNOSIS — I10 Essential (primary) hypertension: Principal | ICD-10-CM

## 2018-07-14 DIAGNOSIS — Z950 Presence of cardiac pacemaker: ICD-10-CM

## 2018-07-14 DIAGNOSIS — E785 Hyperlipidemia, unspecified: ICD-10-CM

## 2018-07-14 DIAGNOSIS — I4891 Unspecified atrial fibrillation: ICD-10-CM

## 2018-07-20 ENCOUNTER — Encounter: Admit: 2018-07-20 | Discharge: 2018-07-20 | Payer: MEDICARE

## 2018-07-20 DIAGNOSIS — I4891 Unspecified atrial fibrillation: Principal | ICD-10-CM

## 2018-07-20 DIAGNOSIS — I482 Chronic atrial fibrillation, unspecified: Principal | ICD-10-CM

## 2018-07-20 LAB — PROTIME INR (PT): Lab: 2.4

## 2018-07-27 ENCOUNTER — Encounter: Admit: 2018-07-27 | Discharge: 2018-07-27 | Payer: MEDICARE

## 2018-07-27 DIAGNOSIS — I4891 Unspecified atrial fibrillation: Principal | ICD-10-CM

## 2018-07-27 DIAGNOSIS — I482 Chronic atrial fibrillation, unspecified: Principal | ICD-10-CM

## 2018-07-27 LAB — PROTIME INR (PT): Lab: 2.5

## 2018-07-28 ENCOUNTER — Encounter: Admit: 2018-07-28 | Discharge: 2018-07-28 | Payer: MEDICARE

## 2018-07-28 DIAGNOSIS — I4891 Unspecified atrial fibrillation: Principal | ICD-10-CM

## 2018-07-30 ENCOUNTER — Ambulatory Visit: Admit: 2018-07-30 | Discharge: 2018-07-31 | Payer: MEDICARE

## 2018-07-31 DIAGNOSIS — Z950 Presence of cardiac pacemaker: Principal | ICD-10-CM

## 2018-08-17 ENCOUNTER — Encounter: Admit: 2018-08-17 | Discharge: 2018-08-17 | Payer: MEDICARE

## 2018-08-17 DIAGNOSIS — I482 Chronic atrial fibrillation, unspecified: Principal | ICD-10-CM

## 2018-08-17 LAB — PROTIME INR (PT): Lab: 2.5

## 2018-09-08 ENCOUNTER — Encounter: Admit: 2018-09-08 | Discharge: 2018-09-08 | Payer: MEDICARE

## 2018-09-08 DIAGNOSIS — I4891 Unspecified atrial fibrillation: Secondary | ICD-10-CM

## 2018-09-08 DIAGNOSIS — R001 Bradycardia, unspecified: Secondary | ICD-10-CM

## 2018-09-08 DIAGNOSIS — I10 Essential (primary) hypertension: Secondary | ICD-10-CM

## 2018-09-08 DIAGNOSIS — E785 Hyperlipidemia, unspecified: Secondary | ICD-10-CM

## 2018-09-14 ENCOUNTER — Encounter: Admit: 2018-09-14 | Discharge: 2018-09-14 | Payer: MEDICARE

## 2018-09-14 DIAGNOSIS — I482 Chronic atrial fibrillation, unspecified: Secondary | ICD-10-CM

## 2018-09-14 LAB — PROTIME INR (PT): Lab: 2.6

## 2018-09-18 ENCOUNTER — Encounter: Admit: 2018-09-18 | Discharge: 2018-09-18 | Payer: MEDICARE

## 2018-10-12 ENCOUNTER — Encounter: Admit: 2018-10-12 | Discharge: 2018-10-12 | Payer: MEDICARE

## 2018-10-12 DIAGNOSIS — I4891 Unspecified atrial fibrillation: Principal | ICD-10-CM

## 2018-10-12 LAB — PROTIME INR (PT): Lab: 2

## 2018-10-29 ENCOUNTER — Ambulatory Visit: Admit: 2018-10-29 | Discharge: 2018-10-30 | Payer: MEDICARE

## 2018-10-29 ENCOUNTER — Encounter: Admit: 2018-10-29 | Discharge: 2018-10-29 | Payer: MEDICARE

## 2018-10-29 IMAGING — CR CHEST
1 series · 1 of 1 positions shown · non-contrast
Comparison: none

[chest port x-wise]
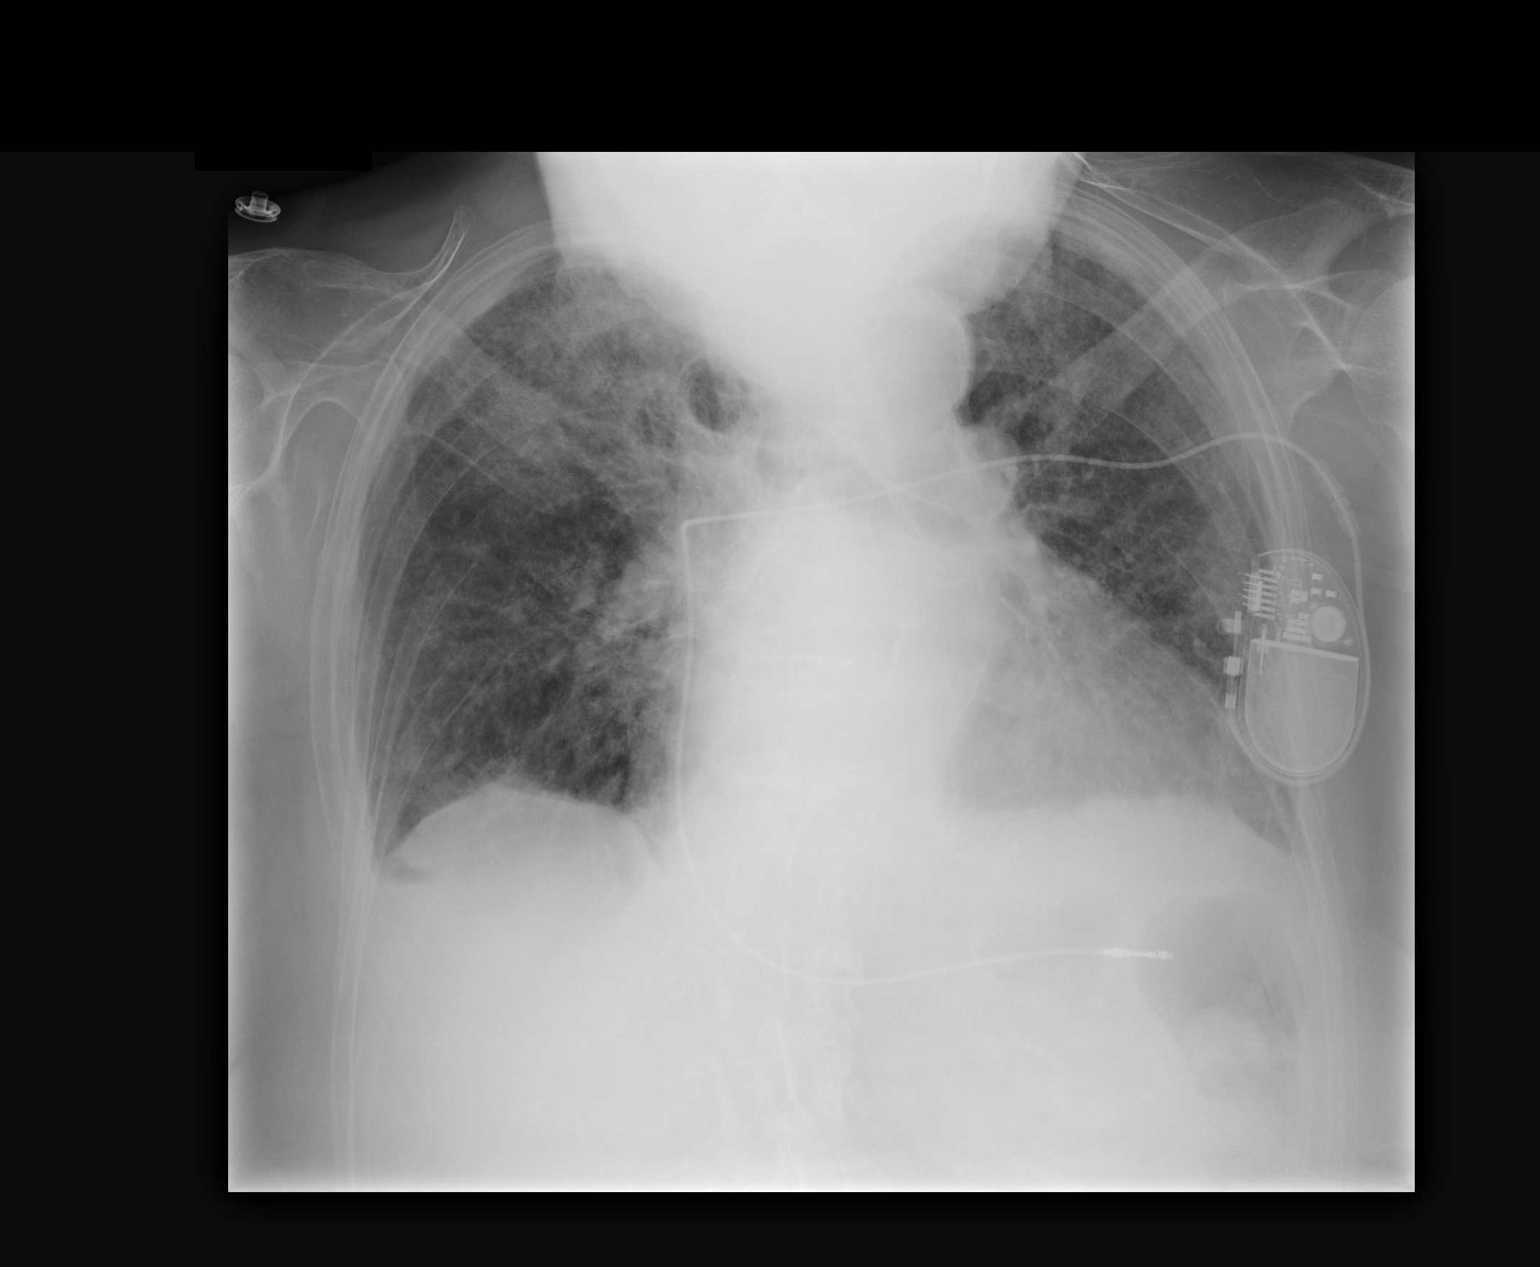

[1 of 1 positions shown; findings below may reference images not displayed]

OUTLAW; FANFAN, ZARA; DUNTON, IMAN; ERXLEBEN, FERIENHAUS; JUMPER, NAZARETH; BILLIOT, ANGI;
MOYAROSALES, BMIDENCE; SASELE, VETAAPI; VANGA, DUFO; OHALLORAN, MERIDITH; BOS, DAVION;
ERIKSON, DAMARIS; MIRICA, JOAKI; OLIVIA, AMISHA; ARNOLD, NEON; GULICK, KUN;

EXAM
Portable chest x-ray.

INDICATION
SOA
soa, hx htn, chf, a-fib, pacemaker - prev cxr 11/18/17- [place]

FINDINGS
A single portable view of the chest was obtained. The prior study was reviewed from 11/18/2017.
There is cardiomegaly. There is mild central pulmonary vascular congestion. There is scattered
chronic interstitial opacity in both lungs.
There is alveolar consolidation in the right upper lobe. There is mild patchy alveolar opacity in
the left parahilar region.
Pacemaker and transvenous electrodes are present.
Inspiratory effort is suboptimal.

IMPRESSION
There has been development of right upper lobe alveolar consolidation most likely representing
pneumonia. There is cardiomegaly. There is mild central pulmonary vascular congestion. There is mild
chronic interstitial lung disease.

Tech Notes:

soa, hx htn, chf, a-fib, pacemaker - prev cxr 11/18/17- [place]

## 2018-10-30 DIAGNOSIS — Z950 Presence of cardiac pacemaker: ICD-10-CM

## 2018-10-30 DIAGNOSIS — R55 Syncope and collapse: Principal | ICD-10-CM

## 2018-11-09 ENCOUNTER — Encounter: Admit: 2018-11-09 | Discharge: 2018-11-09 | Payer: MEDICARE

## 2018-11-09 LAB — PROTIME INR (PT): Lab: 2.2

## 2018-11-24 ENCOUNTER — Encounter: Admit: 2018-11-24 | Discharge: 2018-11-24 | Payer: MEDICARE

## 2018-11-24 NOTE — Telephone Encounter
Nurse from [facility] called asking if okay to postpone INR for a couple of weeks.  [facility] is limiting incoming visitors and would like to hold off on the lab technician entering the building during the COVID-19 threat unless it is emergent.  Advised ok to hold off for now and will review with SDO.  Will call nurse back with [facility] recommendations.     [facility] [phone]

## 2018-12-18 ENCOUNTER — Encounter: Admit: 2018-12-18 | Discharge: 2018-12-18 | Payer: MEDICARE

## 2018-12-18 LAB — PROTIME INR (PT): Lab: 2.9

## 2019-01-22 ENCOUNTER — Encounter: Admit: 2019-01-22 | Discharge: 2019-01-22

## 2019-01-22 NOTE — Progress Notes
Left msg with INR results and f/u plan on pt's preferred # as well as [facility] main #. Left cardiology [facility] nurse line  # for call back prn. No further needs identified at this time.

## 2019-01-28 ENCOUNTER — Ambulatory Visit: Admit: 2019-01-28 | Discharge: 2019-01-28

## 2019-01-28 ENCOUNTER — Encounter: Admit: 2019-01-28 | Discharge: 2019-01-28

## 2019-01-28 DIAGNOSIS — Z950 Presence of cardiac pacemaker: Secondary | ICD-10-CM

## 2019-01-28 DIAGNOSIS — R55 Syncope and collapse: Secondary | ICD-10-CM

## 2019-02-17 ENCOUNTER — Encounter: Admit: 2019-02-17 | Discharge: 2019-02-17

## 2019-02-17 DIAGNOSIS — I4891 Unspecified atrial fibrillation: Secondary | ICD-10-CM

## 2019-02-17 NOTE — Progress Notes
Called pt's nurse Ann at the [facility]. She says there is less variety in pt's diets recently since [place]. Otherwise she denies changes in pt's diet or other meds. Reviewed dosing with her, she verbalizes understanding.

## 2019-02-24 ENCOUNTER — Encounter: Admit: 2019-02-24 | Discharge: 2019-02-24

## 2019-02-24 DIAGNOSIS — I4891 Unspecified atrial fibrillation: Secondary | ICD-10-CM

## 2019-03-03 ENCOUNTER — Encounter: Admit: 2019-03-03 | Discharge: 2019-03-03

## 2019-03-03 NOTE — Progress Notes
03/03/2019 4:51 PM Reviewed INR and dosing with patient's RN, Lelon Frohlich.  Continue current maintenance plan and recheck INR on 03/10/2019.  Jacklynn Barnacle, RN

## 2019-03-11 ENCOUNTER — Encounter: Admit: 2019-03-11 | Discharge: 2019-03-11

## 2019-03-11 NOTE — Progress Notes
Left msg at cell/preferred # for pt re: INR results. Pt to continue current therapeutic dose and recheck in ~ 2 weeks. Left cardiology [facility] nurse line  # for call back prn. No further needs identified at this time.

## 2019-03-23 ENCOUNTER — Encounter: Admit: 2019-03-23 | Discharge: 2019-03-23

## 2019-03-23 NOTE — Telephone Encounter
Received fax from [facility] stating that Desiree Meadows had a 4.3 pound weight increase over the past 7 days.  The note also states that she has 2-3+ edema in lower extremities.  No complaints of shortness of breath and is not requiring additional oxygen therapy.  Her O2 sat is around 90-93%.      Pt is scheduled to see Dr. Ricard Dillon in follow-up on 8/6 in [place].  Will route to Dr. Ricard Dillon for any additional recommendations prior to [place] on Thursday.

## 2019-03-24 ENCOUNTER — Encounter: Admit: 2019-03-24 | Discharge: 2019-03-24

## 2019-03-24 NOTE — Telephone Encounter
Message   Received: Yesterday   Message Contents   Michiel Cowboy, MD  Baldwin Crown, RN   Caller: Unspecified (Yesterday, 1:47 PM)            If she's still getting torsemide 40 mg q AM, can we have her take an extra 20 mg in the afternoon today and tomorrow?      Called and discussed Dr. Ricard Dillon' recommendations with Suzi Roots, RN at [facility] center.  She verbalized understanding and confirmed recommendations.  She states that she will try to get Dorris Carnes to take the extra dose, but she is unsure if she will.  They will follow up tomorrow with Dr. Ricard Dillon in [place].

## 2019-03-24 NOTE — Progress Notes
Have attempted to reach nursing staff at [facility] x 2 but only getting voicemail stating receptionist unable to take call. Routed to [facility] staff for additional f/u attempts this afternoon. Pt has OV tomorrow with Dr. Tyson Alias [facility]).

## 2019-03-25 ENCOUNTER — Encounter: Admit: 2019-03-25 | Discharge: 2019-03-25

## 2019-03-25 ENCOUNTER — Ambulatory Visit: Admit: 2019-03-25 | Discharge: 2019-03-25

## 2019-03-25 DIAGNOSIS — I5032 Chronic diastolic (congestive) heart failure: Secondary | ICD-10-CM

## 2019-03-25 DIAGNOSIS — I4891 Unspecified atrial fibrillation: Secondary | ICD-10-CM

## 2019-03-25 DIAGNOSIS — I10 Essential (primary) hypertension: Secondary | ICD-10-CM

## 2019-03-25 DIAGNOSIS — E785 Hyperlipidemia, unspecified: Secondary | ICD-10-CM

## 2019-03-25 DIAGNOSIS — R001 Bradycardia, unspecified: Secondary | ICD-10-CM

## 2019-03-25 DIAGNOSIS — E039 Hypothyroidism, unspecified: Secondary | ICD-10-CM

## 2019-03-25 NOTE — Assessment & Plan Note
She seems to be doing better on the increased dose of torsemide and I recommended that we continue 40 mg/day.  We will arrange for a basic metabolic profile in about 3 weeks.

## 2019-03-25 NOTE — Assessment & Plan Note
No bleeding issues and heart rate seems to be well controlled.

## 2019-03-25 NOTE — Assessment & Plan Note
Her TSH was quite low a week ago and her thyroid dosage decreased appropriately.

## 2019-03-25 NOTE — Progress Notes
BMP due end of Aug.    Order faxed to [facility] [phone].

## 2019-03-25 NOTE — Assessment & Plan Note
Blood pressure looks great on the current program.  The goal is an average of 130/80 or less.

## 2019-03-25 NOTE — Progress Notes
Date of Service: 03/25/2019    Desiree Meadows is a 85 y.o. female.   Medical assistant/scheduling obtained patient's verbal consent to treat them and their agreement to [facility] financial policy and NPP via this telehealth visit during the [facility] Emergency. This video home visit was conducted via a call between the patient and physician/provider due to COVID-19. We have found that certain health care needs can be provided without an in-person visit.. This service lets [place] provide the care patients' need.  If a prescription is necessary we can send it directly to their pharmacy.  If testing is necessary this can be arranged.        Additional time was spent on reviewing interim medical documentation and coordination of care  Provider Call Started at 0945  Provider Call Ended at 1015      HPI     [name] was on Zoom today from her room at the [facility].  She was really quite sharp today and definitely on her again.    She says that she still having peripheral edema but that the swelling goes down at night.  She seems to be tolerating the increased dose of torsemide and is not complaining about frequent urination or other issues that she has had in the past.  She says that her breathing is comfortable at rest and that if she does any sort of unusual exertion she will get a little bit of breathlessness, but it does not seem to be bothering her too much.    She is going to the dining room for her meals and they are receiving only 3 people per table.  I do not think she is been going to [place] but she does get together with 1 other friend to pray.    She is not having any bleeding complications nor has she had any TIA or stroke symptoms.  She denies any problems with palpitations or lightheadedness.           Vitals:    03/25/19 0931   BP: 129/54   BP Source: Arm, Right Upper   Pulse: 58   Weight: 70 kg (154 lb 6.4 oz)   Height: 1.676 m (5' 6)   PainSc: Zero Body mass index is 24.92 kg/m???.     Past Medical History  Patient Active Problem List    Diagnosis Date Noted   ??? Chronic heart failure with preserved ejection fraction (HCC) 01/10/2015   ??? Cardiac pacemaker 01/04/2014     01/04/14 St. Jude VVIR pacemaker implanted by [facility]    04/07/14 - Re-programmed from VVI to VVIR mode.  Patient had complained of subtle symptoms with exertion while in VVI mode     ??? Hyperlipidemia 01/31/2011   ??? Atrial fibrillation (HCC) 01/12/2009      rate is controlled and the patient is orally      anticoagulated.     ??? HTN (hypertension) 01/12/2009     Poorly controlled     ??? Hypothyroidism 01/12/2009     on thyroid replacement therapy.           Review of Systems   Constitution: Positive for weight gain.   HENT: Negative.    Eyes: Negative.    Cardiovascular: Positive for dyspnea on exertion and leg swelling.   Respiratory: Positive for shortness of breath.    Endocrine: Negative.    Hematologic/Lymphatic: Negative.    Skin: Negative.    Musculoskeletal: Negative.    Gastrointestinal:  Negative.    Genitourinary: Negative.    Neurological: Negative.    Psychiatric/Behavioral: Negative.    Allergic/Immunologic: Negative.        Physical Exam    General:  Resting comfortably and in no apparent distress  HEENT:  Extra-ocular movements intact and oropharynx appears clear  Neck:  Neck veins are flat and not distended  Chest:  Chest is normal in appearance  Respiratory:  Normal, unlabored breathing  Extremities:  No lower extremity swelling  Skin:  Intact with no visible rash, discoloration, or wounds  Digits and Nails:  No clubbing  Neurologic:  Oriented to time, place, and person.  No dysarthria.  No facial asymmetry.  Psychiatric:  Mood and affect are normal  Other:  Moves all extremities        Problems Addressed Today  Encounter Diagnoses   Name Primary?   ??? Atrial fibrillation, unspecified type (HCC)    ??? Essential hypertension    ??? Hypothyroidism, unspecified type ??? Chronic heart failure with preserved ejection fraction (HCC)        Assessment and Plan       Atrial fibrillation (HCC)  No bleeding issues and heart rate seems to be well controlled.    HTN (hypertension)  Blood pressure looks great on the current program.  The goal is an average of 130/80 or less.    Hypothyroidism  Her TSH was quite low a week ago and her thyroid dosage decreased appropriately.    Chronic heart failure with preserved ejection fraction (HCC)  She seems to be doing better on the increased dose of torsemide and I recommended that we continue 40 mg/day.  We will arrange for a basic metabolic profile in about 3 weeks.      Current Medications (including today's revisions)  ??? acetaminophen (TYLENOL) 500 mg tablet Take 500 mg by mouth twice daily. Max of 4,000 mg of acetaminophen in 24 hours.    ??? amLODIPine (NORVASC) 5 mg tablet Take 5 mg by mouth daily.   ??? CALCIUM CARBONATE/VITAMIN D3 (CALCIUM + D PO) Take 1 Tab by mouth Daily.   ??? cyanocobalamin (VITAMIN B-12) 1,000 mcg/mL injection Inject 1,000 mcg to area(s) as directed Every 30 Days.   ??? diphenhydrAMINE (BENADRYL) 50 mg tablet Take one tablet the morning of your procedure (Patient taking differently: as Needed.)   ??? docusate (COLACE) 100 mg capsule Take 100 mg by mouth twice daily.   ??? levothyroxine (SYNTHROID) 150 mcg tablet Take 150 mcg by mouth daily 30 minutes before breakfast.   ??? losartan (COZAAR) 25 mg tablet Take 25 mg by mouth daily.   ??? LYRICA 75 mg capsule Take 75 mg by mouth daily.   ??? mirtazapine (REMERON) 15 mg tablet Take 15 mg by mouth at bedtime daily.   ??? MULTIVITAMIN PO Take 1 tablet by mouth daily.   ??? polyethylene glycol 3350 (GAVILAX) 17 gram/dose powder Take 17 g by mouth twice daily as needed for Constipation.   ??? potassium chloride SR (K-DUR) 20 mEq tablet Take 20 mEq by mouth daily. Take with a meal and a full glass of water.   ??? prednisolone acetate (PRED FORTE) 1 % ophthalmic suspension Apply 1 drop to right eye as directed daily.   ??? torsemide(+) (DEMADEX) 20 mg tablet Take 40 mg by mouth daily.   ??? vitamins, B complex tab Take 1 tablet by mouth daily.   ??? warfarin (COUMADIN) 3 mg tablet TAKE 1 TABLET BY MOUTH MON-TUE-THUR-FRI-SAT AND 1.5 TABLETS (4.5MG )  WED AND SUN

## 2019-03-31 ENCOUNTER — Encounter: Admit: 2019-03-31 | Discharge: 2019-03-31

## 2019-04-05 ENCOUNTER — Encounter: Admit: 2019-04-05 | Discharge: 2019-04-05 | Payer: MEDICARE

## 2019-04-05 NOTE — Telephone Encounter
Received a fax from [place] weight is down 2.6 lbs from last week.  I called and asked them to notify us is her weight goes up 3 lbs overnight or 7 lbs in one week. Gaynelle Cage, RN'

## 2019-04-15 ENCOUNTER — Encounter: Admit: 2019-04-15 | Discharge: 2019-04-15

## 2019-04-15 NOTE — Progress Notes
Left msg for nursing staff at [facility] re: INR results. Pt to continue current therapeutic dose and recheck in ~ 3 weeks.   Left cardiology [facility] nurse line  # for call back prn. No further needs identified at this time.

## 2019-04-28 LAB — BASIC METABOLIC PANEL
Lab: 1.1 — ABNORMAL HIGH (ref 0.57–1.11)
Lab: 102 mL/min (ref >60)
Lab: 143 10*3/uL — ABNORMAL LOW (ref 3–12)
Lab: 27 K/UL (ref 0–0.20)
Lab: 4.4 mL/min — ABNORMAL LOW (ref >60)
Lab: 41 — ABNORMAL HIGH (ref 9.8–20.1)

## 2019-04-29 ENCOUNTER — Encounter: Admit: 2019-04-29 | Discharge: 2019-04-29

## 2019-04-29 DIAGNOSIS — I4891 Unspecified atrial fibrillation: Secondary | ICD-10-CM

## 2019-04-29 DIAGNOSIS — I5032 Chronic diastolic (congestive) heart failure: Secondary | ICD-10-CM

## 2019-04-29 DIAGNOSIS — I10 Essential (primary) hypertension: Secondary | ICD-10-CM

## 2019-05-05 ENCOUNTER — Encounter: Admit: 2019-05-05 | Discharge: 2019-05-05 | Payer: MEDICARE

## 2019-05-17 NOTE — Telephone Encounter
Received fax from [facility] notifying us of 7 lbs weight gain over 7 days. Patient has 2+ pitting edema in BLE, no other symptoms. Dr. Eino Farber consulted about patient due to Dr. Ricard Dillon being out of office at this time. Dr. Eino Farber recommends one extra dose of 20mg  torsemide today. [facility] nurse, Lelon Frohlich, notified of recommendations. They will continue to monitor and call with any additional needs or concerns.

## 2019-06-02 ENCOUNTER — Encounter: Admit: 2019-06-02 | Discharge: 2019-06-02 | Payer: MEDICARE

## 2019-06-30 ENCOUNTER — Encounter: Admit: 2019-06-30 | Discharge: 2019-06-30 | Payer: MEDICARE

## 2019-06-30 DIAGNOSIS — I4891 Unspecified atrial fibrillation: Secondary | ICD-10-CM

## 2019-07-28 ENCOUNTER — Encounter: Admit: 2019-07-28 | Discharge: 2019-07-28 | Payer: MEDICARE

## 2019-07-28 DIAGNOSIS — Z950 Presence of cardiac pacemaker: Secondary | ICD-10-CM

## 2019-07-28 DIAGNOSIS — I4891 Unspecified atrial fibrillation: Secondary | ICD-10-CM

## 2019-08-25 ENCOUNTER — Encounter

## 2019-08-25 DIAGNOSIS — I4891 Unspecified atrial fibrillation: Secondary | ICD-10-CM

## 2019-08-25 NOTE — Progress Notes
08/25/2019 3:44 PM   Verified dose with Deb LPN .

## 2019-09-14 ENCOUNTER — Encounter: Admit: 2019-09-14 | Discharge: 2019-09-14 | Payer: MEDICARE

## 2019-09-14 DIAGNOSIS — I4891 Unspecified atrial fibrillation: Secondary | ICD-10-CM

## 2019-09-14 DIAGNOSIS — I482 Chronic atrial fibrillation, unspecified: Secondary | ICD-10-CM

## 2019-09-14 DIAGNOSIS — Z950 Presence of cardiac pacemaker: Secondary | ICD-10-CM

## 2019-09-22 ENCOUNTER — Encounter: Admit: 2019-09-22 | Discharge: 2019-09-22 | Payer: MEDICARE

## 2019-09-22 DIAGNOSIS — I4891 Unspecified atrial fibrillation: Secondary | ICD-10-CM

## 2019-10-13 ENCOUNTER — Encounter: Admit: 2019-10-13 | Discharge: 2019-10-13 | Payer: MEDICARE

## 2019-10-13 DIAGNOSIS — Z950 Presence of cardiac pacemaker: Secondary | ICD-10-CM

## 2019-10-13 DIAGNOSIS — I4891 Unspecified atrial fibrillation: Secondary | ICD-10-CM

## 2019-10-13 LAB — PROTIME INR (PT): Lab: 3.4

## 2019-10-20 ENCOUNTER — Encounter: Admit: 2019-10-20 | Discharge: 2019-10-20 | Payer: MEDICARE

## 2019-10-20 DIAGNOSIS — I4891 Unspecified atrial fibrillation: Secondary | ICD-10-CM

## 2019-10-20 DIAGNOSIS — Z950 Presence of cardiac pacemaker: Secondary | ICD-10-CM

## 2019-10-20 LAB — PROTIME INR (PT): Lab: 1.6

## 2019-10-27 ENCOUNTER — Encounter: Admit: 2019-10-27 | Discharge: 2019-10-27 | Payer: MEDICARE

## 2019-10-27 DIAGNOSIS — I4891 Unspecified atrial fibrillation: Secondary | ICD-10-CM

## 2019-10-27 DIAGNOSIS — Z950 Presence of cardiac pacemaker: Secondary | ICD-10-CM

## 2019-10-27 LAB — PROTIME INR (PT): Lab: 2.5

## 2019-10-28 ENCOUNTER — Ambulatory Visit: Admit: 2019-10-28 | Discharge: 2019-10-28 | Payer: MEDICARE

## 2019-11-03 ENCOUNTER — Encounter: Admit: 2019-11-03 | Discharge: 2019-11-03 | Payer: MEDICARE

## 2019-11-03 DIAGNOSIS — I4891 Unspecified atrial fibrillation: Secondary | ICD-10-CM

## 2019-11-10 ENCOUNTER — Encounter: Admit: 2019-11-10 | Discharge: 2019-11-10 | Payer: MEDICARE

## 2019-11-10 DIAGNOSIS — I4891 Unspecified atrial fibrillation: Secondary | ICD-10-CM

## 2019-11-10 DIAGNOSIS — Z950 Presence of cardiac pacemaker: Secondary | ICD-10-CM

## 2019-11-10 LAB — PROTIME INR (PT): Lab: 3

## 2019-11-16 ENCOUNTER — Encounter: Admit: 2019-11-16 | Discharge: 2019-11-16 | Payer: MEDICARE

## 2019-11-16 DIAGNOSIS — Z950 Presence of cardiac pacemaker: Secondary | ICD-10-CM

## 2019-11-16 DIAGNOSIS — R001 Bradycardia, unspecified: Secondary | ICD-10-CM

## 2019-11-16 DIAGNOSIS — I10 Essential (primary) hypertension: Secondary | ICD-10-CM

## 2019-11-16 DIAGNOSIS — I4891 Unspecified atrial fibrillation: Secondary | ICD-10-CM

## 2019-11-16 DIAGNOSIS — E785 Hyperlipidemia, unspecified: Secondary | ICD-10-CM

## 2019-11-16 NOTE — Progress Notes
Date of Service: 11/16/2019    Desiree Meadows Desiree Meadows is a 86 y.o. female.       HPI     Desiree Meadows was in the [place] clinic today for follow-up regarding atrial fibrillation and a pacemaker.  She seemed to be pretty sharp and cheerful today.  I am not sure that is always the case back at the [facility]!    She is looking forward to regular church services on Easter.  Apparently the order will be in a larger Chapel so that they can distance somewhat in the pews.    From a cardiac standpoint she seems stable and she denies any recent problems with breathlessness or chest pain.  She has had no palpitations or lightheadedness.  She denies any TIA or stroke symptoms.  She remains on oral anticoagulation and has had no bleeding complications.         Vitals:    11/16/19 1438   BP: 126/88   BP Source: Arm, Right Upper   Patient Position: Sitting   Pulse: 93   SpO2: 92%   Weight: 70.8 kg (156 lb)   Height: 1.676 m (5' 6)   PainSc: Zero     Body mass index is 25.18 kg/m?Marland Kitchen     Past Medical History  Patient Active Problem List    Diagnosis Date Noted   ? Chronic heart failure with preserved ejection fraction (HCC) 01/10/2015   ? Cardiac pacemaker 01/04/2014     01/04/14 [employer] VVIR pacemaker implanted by [facility]    04/07/14 - Re-programmed from VVI to VVIR mode.  Patient had complained of subtle symptoms with exertion while in VVI mode     ? Hyperlipidemia 01/31/2011   ? Atrial fibrillation (HCC) 01/12/2009      rate is controlled and the patient is orally      anticoagulated.     ? HTN (hypertension) 01/12/2009     Poorly controlled     ? Hypothyroidism 01/12/2009     on thyroid replacement therapy.           Review of Systems   Constitution: Negative.   HENT: Negative.    Eyes: Negative.    Cardiovascular: Positive for dyspnea on exertion.   Respiratory: Negative.    Endocrine: Negative.    Hematologic/Lymphatic: Negative.    Skin: Negative.    Musculoskeletal: Negative.    Gastrointestinal: Negative. Genitourinary: Negative.    Neurological: Positive for loss of balance.   Psychiatric/Behavioral: Negative.    Allergic/Immunologic: Negative.        Physical Exam    Physical Exam   General Appearance: no distress   Skin: warm, no ulcers or xanthomas   Digits and Nails: no cyanosis or clubbing   Eyes: conjunctivae and lids normal, pupils are equal and round   Teeth/Gums/Palate: dentition unremarkable, no lesions   Lips & Oral Mucosa: no pallor or cyanosis   Neck Veins: normal JVP , neck veins are not distended   Thyroid: no nodules, masses, tenderness or enlargement   Chest Inspection: chest is normal in appearance   Respiratory Effort: breathing comfortably, no respiratory distress   Auscultation/Percussion: lungs clear to auscultation, no rales or rhonchi, no wheezing   PMI: PMI not enlarged or displaced   Cardiac Rhythm: regular rhythm and normal rate   Cardiac Auscultation: S1, S2 normal, no rub, no gallop   Murmurs: no murmur   Peripheral Circulation: normal peripheral circulation   Carotid Arteries: normal carotid upstroke bilaterally,  no bruits   Radial Arteries: normal symmetric radial pulses   Abdominal Aorta: no abdominal aortic bruit   Pedal Pulses: normal symmetric pedal pulses   Lower Extremity Edema: no lower extremity edema   Abdominal Exam: soft, non-tender, no masses, bowel sounds normal   Liver & Spleen: no organomegaly   Gait & Station: in a wheelchair  Muscle Strength: normal muscle tone   Orientation: oriented to time, place and person   Affect & Mood: appropriate and sustained affect   Language and Memory: patient responsive and seems to comprehend information   Neurologic Exam: neurological assessment grossly intact   Other: moves all extremities      Problems Addressed Today  Encounter Diagnoses   Name Primary?   ? Atrial fibrillation, unspecified type (HCC)    ? Essential hypertension    ? Cardiac pacemaker        Assessment and Plan       Atrial fibrillation (HCC)  No bleeding complications related to oral anticoagulation.  No TIA or stroke symptoms.    HTN (hypertension)  Blood pressure seems to be fine on the current medical program.    Cardiac pacemaker  Remote transmission earlier this month demonstrates normal device function.      Current Medications (including today's revisions)  ? acetaminophen (TYLENOL) 500 mg tablet Take 500 mg by mouth twice daily. Max of 4,000 mg of acetaminophen in 24 hours.    ? amLODIPine (NORVASC) 5 mg tablet Take 5 mg by mouth daily. take 2.5mg  daily   ? benzocaine (HURRICAINE) 20 % gel Take  by mouth once.   ? CALCIUM CARBONATE/VITAMIN D3 (CALCIUM + D PO) Take 1 Tab by mouth Daily.   ? cyanocobalamin (VITAMIN B-12) 1,000 mcg/mL injection Inject 1,000 mcg to area(s) as directed Every 30 Days.   ? diphenhydrAMINE (BENADRYL) 50 mg tablet Take one tablet the morning of your procedure (Patient taking differently: as Needed.)   ? docusate (COLACE) 100 mg capsule Take 100 mg by mouth twice daily.   ? levothyroxine (SYNTHROID) 150 mcg tablet Take 100 mcg by mouth daily 30 minutes before breakfast.   ? losartan (COZAAR) 25 mg tablet Take 25 mg by mouth daily.   ? LYRICA 75 mg capsule Take 75 mg by mouth daily.   ? menthol 4 % gel Apply  topically to affected area.   ? mirtazapine (REMERON) 15 mg tablet Take 7.5 mg by mouth at bedtime daily.   ? MULTIVITAMIN PO Take 1 tablet by mouth daily.   ? nystatin (NYSTOP) 100,000 unit/g topical powder Apply  topically to affected area four times daily.   ? polyethylene glycol 3350 (GAVILAX) 17 gram/dose powder Take 17 g by mouth twice daily as needed for Constipation.   ? potassium chloride SR (K-DUR) 20 mEq tablet Take 20 mEq by mouth daily. Take with a meal and a full glass of water.   ? prednisolone acetate (PRED FORTE) 1 % ophthalmic suspension Apply 1 drop to right eye as directed daily.   ? torsemide(+) (DEMADEX) 20 mg tablet Take 40 mg by mouth daily.   ? vitamins, B complex tab Take 1 tablet by mouth daily.   ? warfarin (COUMADIN) 3 mg tablet TAKE 1 TABLET BY MOUTH MON-TUE-THUR-FRI-SAT AND 1.5 TABLETS (4.5MG ) WED AND SUN     Total time spent on today's office visit was 30 minutes.  This includes face-to-face in person visit with patient as well as nonface-to-face time including review of the EMR, outside records, labs,  radiologic studies, echocardiogram & other cardiovascular studies, formation of treatment plan, after visit summary, future disposition, and lastly on documentation.

## 2019-11-16 NOTE — Assessment & Plan Note
No bleeding complications related to oral anticoagulation.  No TIA or stroke symptoms.

## 2019-11-16 NOTE — Assessment & Plan Note
Blood pressure seems to be fine on the current medical program.

## 2019-11-16 NOTE — Assessment & Plan Note
Remote transmission earlier this month demonstrates normal device function.

## 2019-11-24 ENCOUNTER — Encounter: Admit: 2019-11-24 | Discharge: 2019-11-24 | Payer: MEDICARE

## 2019-11-24 DIAGNOSIS — I4891 Unspecified atrial fibrillation: Secondary | ICD-10-CM

## 2019-11-24 DIAGNOSIS — Z950 Presence of cardiac pacemaker: Secondary | ICD-10-CM

## 2019-11-24 LAB — PROTIME INR (PT): Lab: 2.6

## 2019-12-22 ENCOUNTER — Encounter: Admit: 2019-12-22 | Discharge: 2019-12-22 | Payer: MEDICARE

## 2019-12-22 DIAGNOSIS — I4891 Unspecified atrial fibrillation: Secondary | ICD-10-CM

## 2019-12-22 DIAGNOSIS — Z950 Presence of cardiac pacemaker: Secondary | ICD-10-CM

## 2019-12-22 LAB — PROTIME INR (PT): Lab: 3.1

## 2019-12-29 ENCOUNTER — Encounter: Admit: 2019-12-29 | Discharge: 2019-12-29 | Payer: MEDICARE

## 2020-01-07 ENCOUNTER — Encounter: Admit: 2020-01-07 | Discharge: 2020-01-07 | Payer: MEDICARE

## 2020-01-07 DIAGNOSIS — I4891 Unspecified atrial fibrillation: Secondary | ICD-10-CM

## 2020-01-07 DIAGNOSIS — Z950 Presence of cardiac pacemaker: Secondary | ICD-10-CM

## 2020-01-11 ENCOUNTER — Encounter: Admit: 2020-01-11 | Discharge: 2020-01-11 | Payer: MEDICARE

## 2020-01-11 ENCOUNTER — Ambulatory Visit: Admit: 2020-01-11 | Discharge: 2020-01-11 | Payer: MEDICARE

## 2020-01-11 DIAGNOSIS — I4891 Unspecified atrial fibrillation: Secondary | ICD-10-CM

## 2020-01-11 DIAGNOSIS — Z950 Presence of cardiac pacemaker: Secondary | ICD-10-CM

## 2020-01-12 ENCOUNTER — Encounter: Admit: 2020-01-12 | Discharge: 2020-01-12 | Payer: MEDICARE

## 2020-01-12 DIAGNOSIS — I4891 Unspecified atrial fibrillation: Secondary | ICD-10-CM

## 2020-01-12 DIAGNOSIS — Z950 Presence of cardiac pacemaker: Secondary | ICD-10-CM

## 2020-01-12 LAB — PROTIME INR (PT): Lab: 1.8 — ABNORMAL HIGH (ref 0.89–1.11)

## 2020-01-24 ENCOUNTER — Encounter: Admit: 2020-01-24 | Discharge: 2020-01-24 | Payer: MEDICARE

## 2020-01-24 DIAGNOSIS — Z950 Presence of cardiac pacemaker: Secondary | ICD-10-CM

## 2020-01-24 DIAGNOSIS — I4891 Unspecified atrial fibrillation: Secondary | ICD-10-CM

## 2020-01-24 LAB — PROTIME INR (PT): Lab: 1.7 — ABNORMAL HIGH (ref 0.89–1.11)

## 2020-01-27 ENCOUNTER — Encounter: Admit: 2020-01-27 | Discharge: 2020-01-27 | Payer: MEDICARE

## 2020-01-31 ENCOUNTER — Encounter: Admit: 2020-01-31 | Discharge: 2020-01-31 | Payer: MEDICARE

## 2020-01-31 DIAGNOSIS — I4891 Unspecified atrial fibrillation: Secondary | ICD-10-CM

## 2020-01-31 DIAGNOSIS — Z950 Presence of cardiac pacemaker: Secondary | ICD-10-CM

## 2020-01-31 LAB — PROTIME INR (PT): Lab: 1.8 — ABNORMAL HIGH (ref 0.89–1.11)

## 2020-01-31 NOTE — Progress Notes
INR 1.8, spoke with her nurse, increase to 2mg  daily and re check in a week. tc

## 2020-02-07 ENCOUNTER — Encounter: Admit: 2020-02-07 | Discharge: 2020-02-07 | Payer: MEDICARE

## 2020-02-07 DIAGNOSIS — I4891 Unspecified atrial fibrillation: Secondary | ICD-10-CM

## 2020-02-14 ENCOUNTER — Encounter: Admit: 2020-02-14 | Discharge: 2020-02-14 | Payer: MEDICARE

## 2020-02-14 LAB — PROTIME INR (PT): Lab: 2.4 — ABNORMAL HIGH (ref 0.89–1.11)

## 2020-02-28 ENCOUNTER — Encounter: Admit: 2020-02-28 | Discharge: 2020-02-28 | Payer: MEDICARE

## 2020-02-28 DIAGNOSIS — I4891 Unspecified atrial fibrillation: Secondary | ICD-10-CM

## 2020-02-28 DIAGNOSIS — Z950 Presence of cardiac pacemaker: Secondary | ICD-10-CM

## 2020-02-28 LAB — PROTIME INR (PT): Lab: 2.5

## 2020-03-27 ENCOUNTER — Encounter: Admit: 2020-03-27 | Discharge: 2020-03-27 | Payer: MEDICARE

## 2020-03-27 DIAGNOSIS — Z950 Presence of cardiac pacemaker: Secondary | ICD-10-CM

## 2020-03-27 DIAGNOSIS — I4891 Unspecified atrial fibrillation: Secondary | ICD-10-CM

## 2020-03-27 LAB — PROTIME INR (PT): Lab: 2.6 — ABNORMAL HIGH (ref 0.89–1.11)

## 2020-04-27 ENCOUNTER — Encounter: Admit: 2020-04-27 | Discharge: 2020-04-27 | Payer: MEDICARE

## 2020-04-28 ENCOUNTER — Encounter: Admit: 2020-04-28 | Discharge: 2020-04-28 | Payer: MEDICARE

## 2020-04-28 DIAGNOSIS — Z950 Presence of cardiac pacemaker: Secondary | ICD-10-CM

## 2020-04-28 DIAGNOSIS — I4891 Unspecified atrial fibrillation: Secondary | ICD-10-CM

## 2020-04-28 LAB — PROTIME INR (PT): Lab: 3.2 — ABNORMAL HIGH

## 2020-05-05 ENCOUNTER — Encounter: Admit: 2020-05-05 | Discharge: 2020-05-05 | Payer: MEDICARE

## 2020-05-05 DIAGNOSIS — Z950 Presence of cardiac pacemaker: Secondary | ICD-10-CM

## 2020-05-05 DIAGNOSIS — I4891 Unspecified atrial fibrillation: Secondary | ICD-10-CM

## 2020-05-05 LAB — PROTIME INR (PT): Lab: 3.9

## 2020-05-09 ENCOUNTER — Encounter: Admit: 2020-05-09 | Discharge: 2020-05-09 | Payer: MEDICARE

## 2020-05-09 DIAGNOSIS — I4891 Unspecified atrial fibrillation: Secondary | ICD-10-CM

## 2020-05-09 DIAGNOSIS — Z950 Presence of cardiac pacemaker: Secondary | ICD-10-CM

## 2020-05-09 LAB — PROTIME INR (PT): Lab: 4 — ABNORMAL HIGH (ref 0.89–1.11)

## 2020-05-17 ENCOUNTER — Encounter: Admit: 2020-05-17 | Discharge: 2020-05-17 | Payer: MEDICARE

## 2020-05-17 DIAGNOSIS — I4891 Unspecified atrial fibrillation: Secondary | ICD-10-CM

## 2020-05-17 DIAGNOSIS — Z950 Presence of cardiac pacemaker: Secondary | ICD-10-CM

## 2020-05-17 LAB — PROTIME INR (PT): Lab: 2.8 — ABNORMAL HIGH (ref 0.89–1.11)

## 2020-05-24 ENCOUNTER — Encounter: Admit: 2020-05-24 | Discharge: 2020-05-24 | Payer: MEDICARE

## 2020-06-07 ENCOUNTER — Encounter: Admit: 2020-06-07 | Discharge: 2020-06-07 | Payer: MEDICARE

## 2020-06-07 DIAGNOSIS — I4891 Unspecified atrial fibrillation: Secondary | ICD-10-CM

## 2020-06-07 DIAGNOSIS — Z950 Presence of cardiac pacemaker: Secondary | ICD-10-CM

## 2020-06-07 LAB — PROTIME INR (PT): Lab: 3.7 — ABNORMAL HIGH

## 2020-06-26 ENCOUNTER — Encounter: Admit: 2020-06-26 | Discharge: 2020-06-26 | Payer: MEDICARE

## 2020-06-26 NOTE — Progress Notes
Called cell # in attempt to reach pt re: overdue INR and person answering apologized for not calling us sooner and stated pt passed away a few weeks ago. Advised I would let Dr. Danella Maiers know. No further needs identified at this time.

## 2020-06-28 ENCOUNTER — Encounter: Admit: 2020-06-28 | Discharge: 2020-06-28 | Payer: MEDICARE

## 2020-06-28 NOTE — Telephone Encounter
Received notification that  [employer] Merlin has not been connected since 06/13/20. Chart review shows the patient passed away. Obit search shows the patient passed away on Jun 23, 2020. Deactivated the patients remote monitoring. DB.
